# Patient Record
Sex: Female | Born: 1980 | Race: Black or African American | Hispanic: No | Marital: Single | Smoking: Former smoker
Health system: Southern US, Community
[De-identification: ages and names within clinical notes are randomized; demographics above are authoritative.]

## PROBLEM LIST (undated history)

## (undated) DIAGNOSIS — R011 Cardiac murmur, unspecified: Secondary | ICD-10-CM

## (undated) DIAGNOSIS — R51 Headache: Secondary | ICD-10-CM

## (undated) DIAGNOSIS — A749 Chlamydial infection, unspecified: Secondary | ICD-10-CM

## (undated) DIAGNOSIS — N390 Urinary tract infection, site not specified: Secondary | ICD-10-CM

## (undated) DIAGNOSIS — N730 Acute parametritis and pelvic cellulitis: Secondary | ICD-10-CM

## (undated) DIAGNOSIS — A549 Gonococcal infection, unspecified: Secondary | ICD-10-CM

## (undated) DIAGNOSIS — G8929 Other chronic pain: Secondary | ICD-10-CM

## (undated) HISTORY — PX: APPENDECTOMY: SHX54

## (undated) HISTORY — PX: FOOT SURGERY: SHX648

---
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
## 2002-08-04 ENCOUNTER — Emergency Department (HOSPITAL_COMMUNITY): Admission: EM | Admit: 2002-08-04 | Discharge: 2002-08-04 | Payer: Self-pay | Admitting: Emergency Medicine

## 2002-08-06 ENCOUNTER — Emergency Department (HOSPITAL_COMMUNITY): Admission: EM | Admit: 2002-08-06 | Discharge: 2002-08-06 | Payer: Self-pay | Admitting: Emergency Medicine

## 2005-08-07 ENCOUNTER — Emergency Department (HOSPITAL_COMMUNITY): Admission: EM | Admit: 2005-08-07 | Discharge: 2005-08-07 | Payer: Self-pay | Admitting: Emergency Medicine

## 2006-07-20 ENCOUNTER — Encounter (INDEPENDENT_AMBULATORY_CARE_PROVIDER_SITE_OTHER): Payer: Self-pay | Admitting: *Deleted

## 2006-07-20 ENCOUNTER — Observation Stay (HOSPITAL_COMMUNITY): Admission: EM | Admit: 2006-07-20 | Discharge: 2006-07-21 | Payer: Self-pay | Admitting: Emergency Medicine

## 2007-01-08 ENCOUNTER — Emergency Department (HOSPITAL_COMMUNITY): Admission: EM | Admit: 2007-01-08 | Discharge: 2007-01-08 | Payer: Self-pay | Admitting: Emergency Medicine

## 2007-05-15 ENCOUNTER — Emergency Department (HOSPITAL_COMMUNITY): Admission: EM | Admit: 2007-05-15 | Discharge: 2007-05-15 | Payer: Self-pay | Admitting: Emergency Medicine

## 2007-06-10 ENCOUNTER — Emergency Department (HOSPITAL_COMMUNITY): Admission: EM | Admit: 2007-06-10 | Discharge: 2007-06-10 | Payer: Self-pay | Admitting: *Deleted

## 2007-10-08 IMAGING — CT CT HEAD W/O CM
1 series · 16 of 30 positions shown, 20 images · IV contrast (agent unspecified)
Comparison: none

CLINICAL DATA: Headache for several days. 
 HEAD CT WITHOUT CONTRAST:
TECHNIQUE: Contiguous axial images were obtained from the base of the skull through the vertex according to standard protocol without contrast.

[Series 2: head routine 4.8 h47s · axial · 0.39mm/px · z∈[-197,-69]mm · 16 of 30 slices shown, 20 images]
[im 2/30  brain]
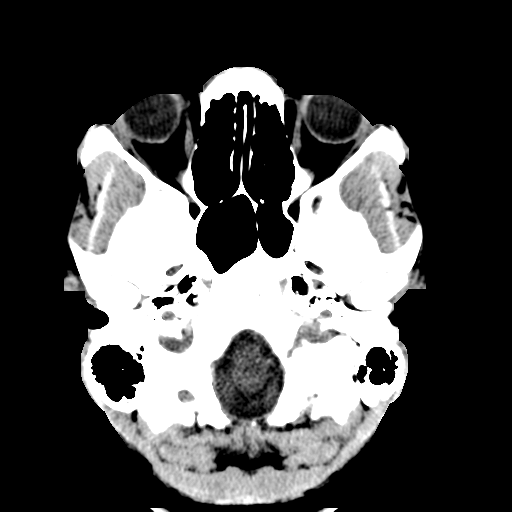
[im 2/30  bone]
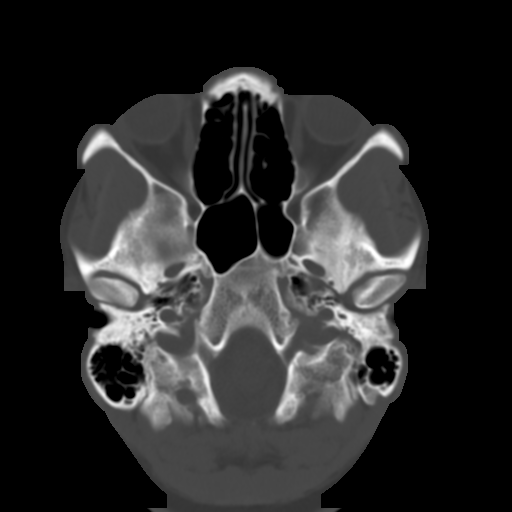
[im 4/30  brain]
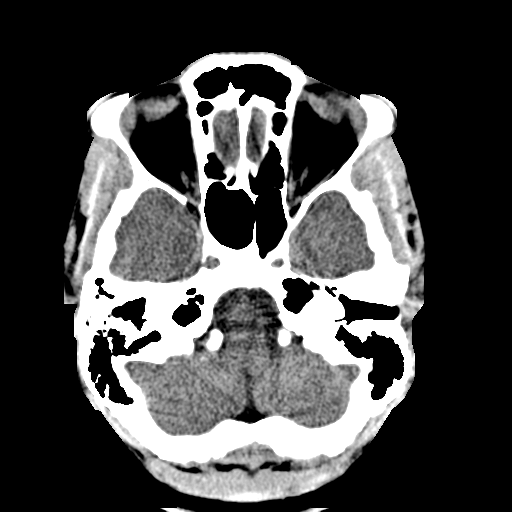
[im 6/30  brain]
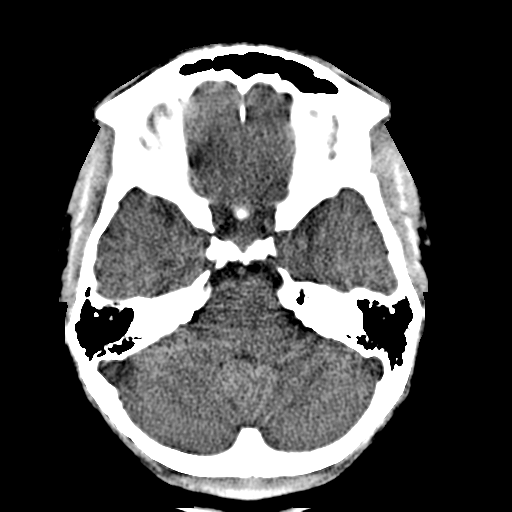
[im 8/30  brain]
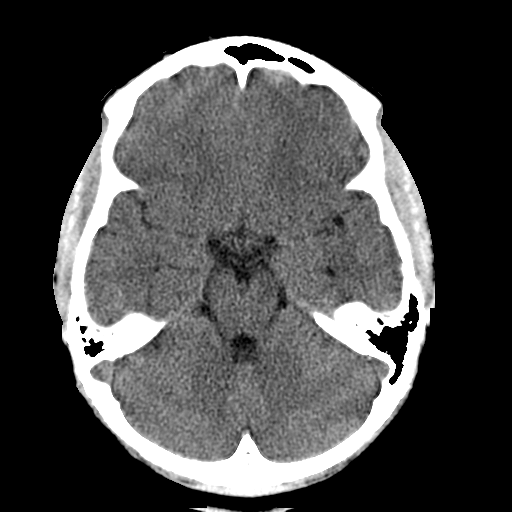
[im 9/30  brain]
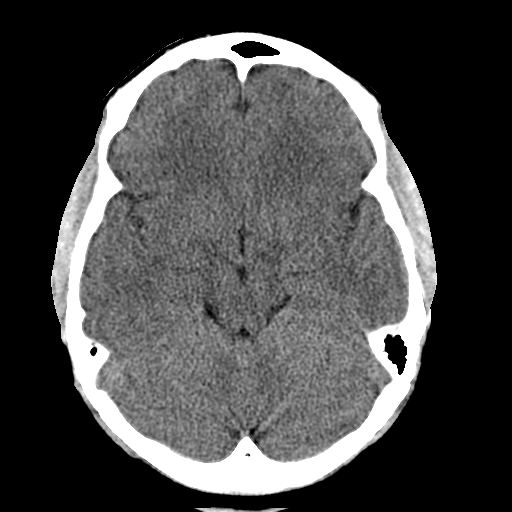
[im 9/30  bone]
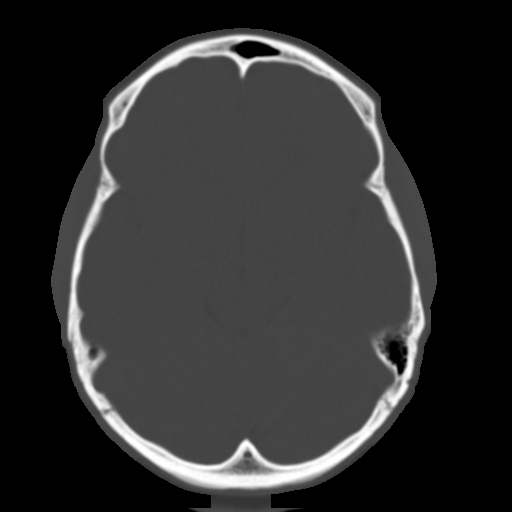
[im 11/30  brain]
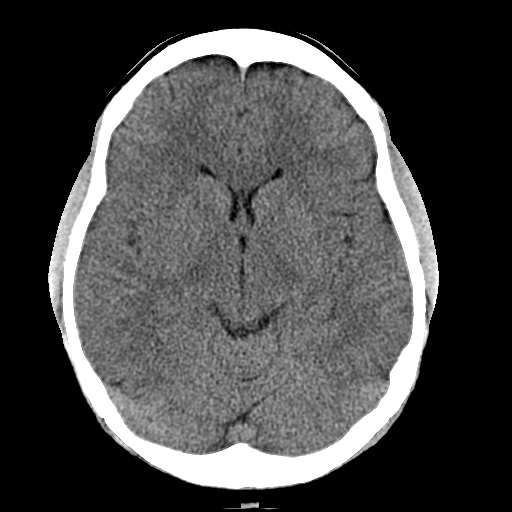
[im 13/30  brain]
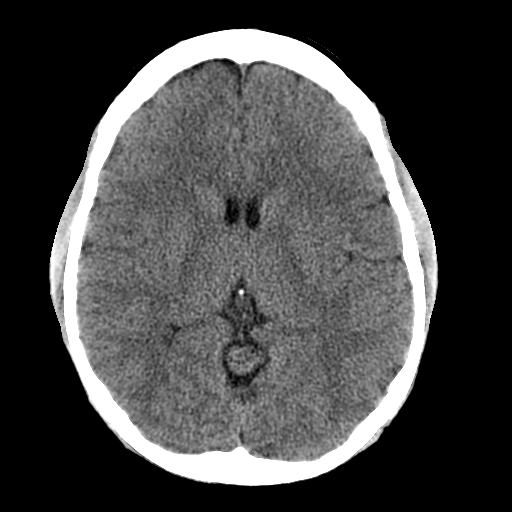
[im 15/30  brain]
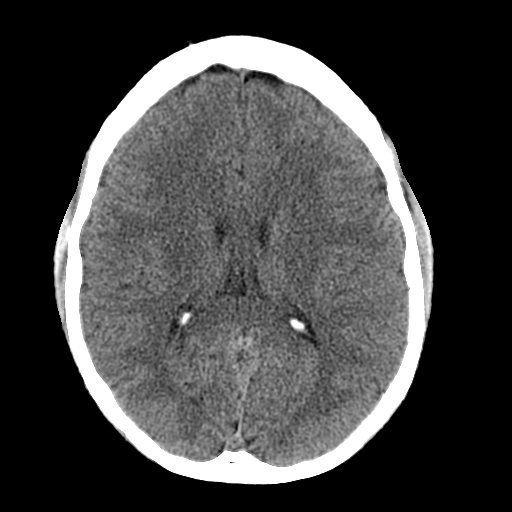
[im 16/30  brain]
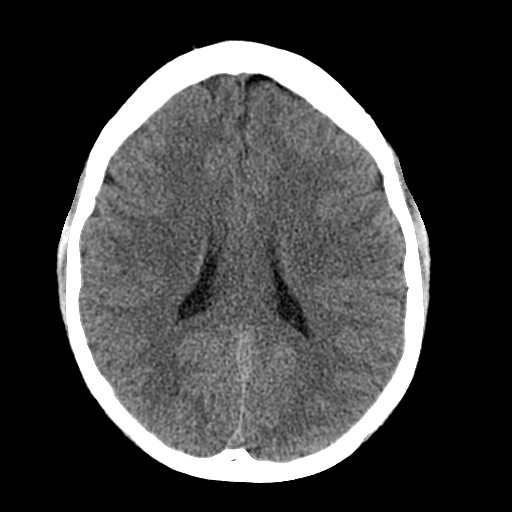
[im 16/30  bone]
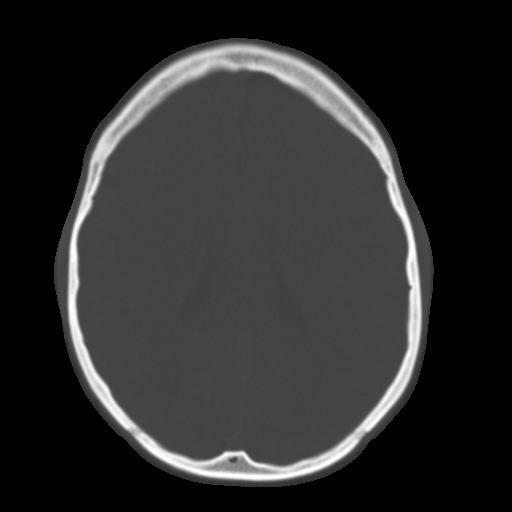
[im 18/30  brain]
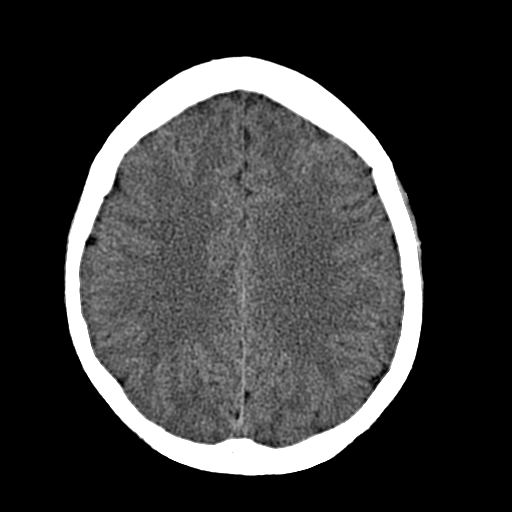
[im 20/30  brain]
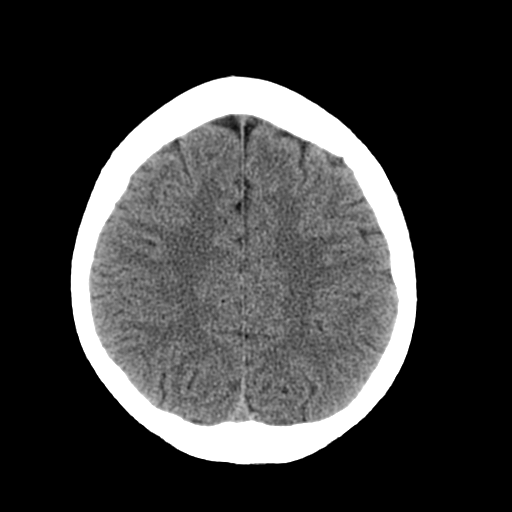
[im 22/30  brain]
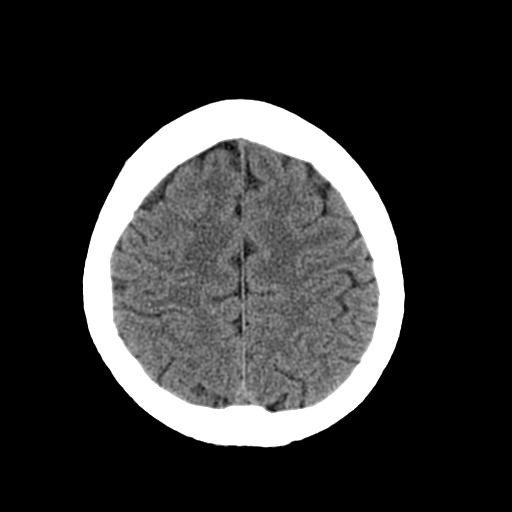
[im 23/30  brain]
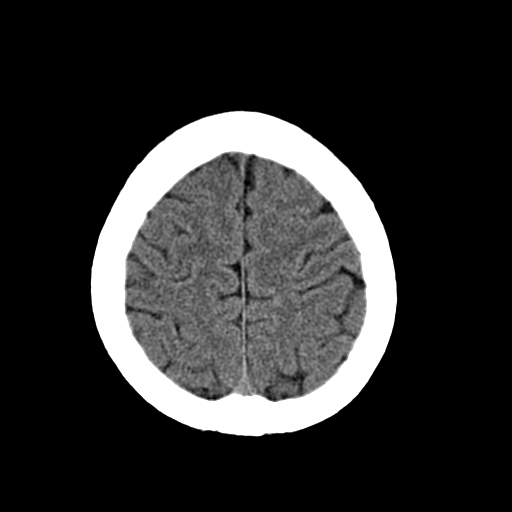
[im 23/30  bone]
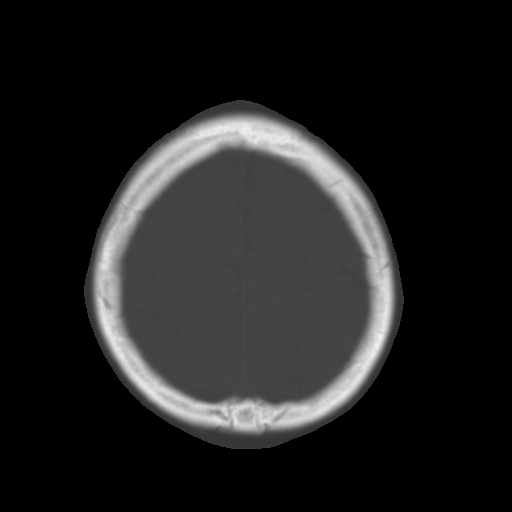
[im 25/30  brain]
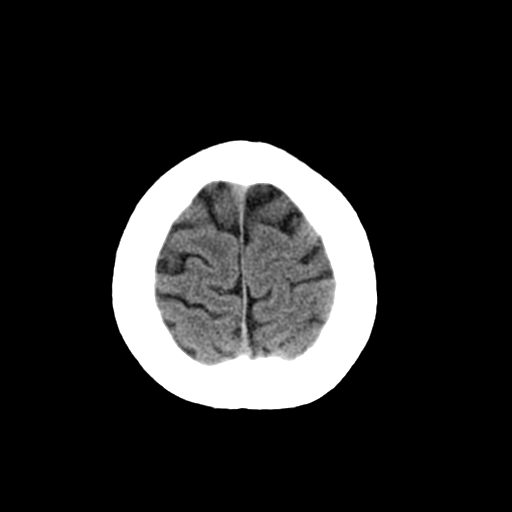
[im 27/30  brain]
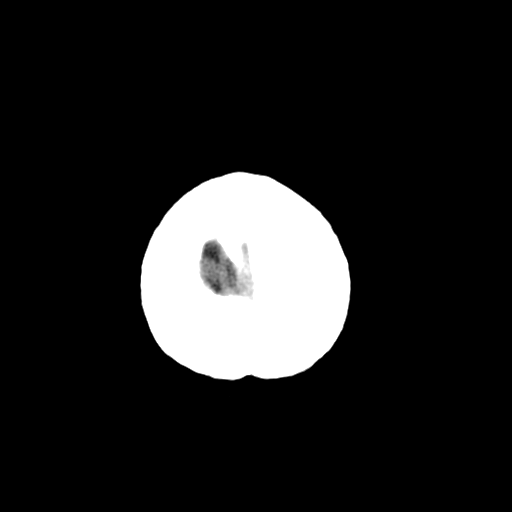
[im 29/30  brain]
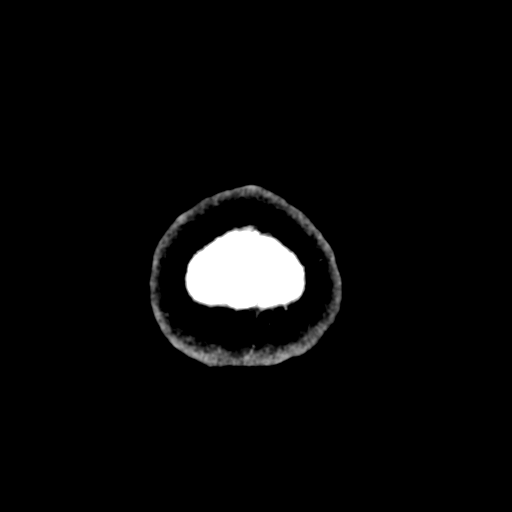

[16 of 30 positions shown; findings below may reference images not displayed]

FINDINGS: The ventricular system is normal in size and configuration and the septum is in a normal midline position. The fourth ventricle and basilar cisterns appear normal. No blood, edema or mass effect is seen. No bony abnormality is noted.
IMPRESSION: No acute intracranial abnormality.

## 2007-10-09 ENCOUNTER — Inpatient Hospital Stay (HOSPITAL_COMMUNITY): Admission: AD | Admit: 2007-10-09 | Discharge: 2007-10-09 | Payer: Self-pay | Admitting: Obstetrics & Gynecology

## 2007-10-23 ENCOUNTER — Inpatient Hospital Stay (HOSPITAL_COMMUNITY): Admission: AD | Admit: 2007-10-23 | Discharge: 2007-10-23 | Payer: Self-pay | Admitting: Obstetrics & Gynecology

## 2007-11-25 ENCOUNTER — Inpatient Hospital Stay (HOSPITAL_COMMUNITY): Admission: AD | Admit: 2007-11-25 | Discharge: 2007-11-25 | Payer: Self-pay | Admitting: Obstetrics

## 2007-11-28 ENCOUNTER — Inpatient Hospital Stay (HOSPITAL_COMMUNITY): Admission: AD | Admit: 2007-11-28 | Discharge: 2007-11-28 | Payer: Self-pay | Admitting: Obstetrics

## 2008-01-10 ENCOUNTER — Ambulatory Visit (HOSPITAL_COMMUNITY): Admission: RE | Admit: 2008-01-10 | Discharge: 2008-01-10 | Payer: Self-pay | Admitting: Obstetrics

## 2008-01-23 ENCOUNTER — Emergency Department (HOSPITAL_COMMUNITY): Admission: EM | Admit: 2008-01-23 | Discharge: 2008-01-23 | Payer: Self-pay | Admitting: Emergency Medicine

## 2008-02-22 ENCOUNTER — Inpatient Hospital Stay (HOSPITAL_COMMUNITY): Admission: AD | Admit: 2008-02-22 | Discharge: 2008-02-23 | Payer: Self-pay | Admitting: Obstetrics

## 2008-03-19 ENCOUNTER — Inpatient Hospital Stay (HOSPITAL_COMMUNITY): Admission: AD | Admit: 2008-03-19 | Discharge: 2008-03-19 | Payer: Self-pay | Admitting: Obstetrics

## 2008-03-24 IMAGING — US US OB COMP LESS 14 WK
1 series · 14 of 28 positions shown · non-contrast
Comparison: none

CLINICAL DATA: 25-year-old female with positive pregnancy test. Abdominal pain and bleeding. 
 OBSTETRICAL ULTRASOUND <14 WKS:
TECHNIQUE: Transabdominal ultrasound was performed for evaluation of the gestation as well as the maternal uterus and adnexal regions.

[Series 1: us ob comp less 14 wks · 14 of 30 slices shown]
[im 2/30]
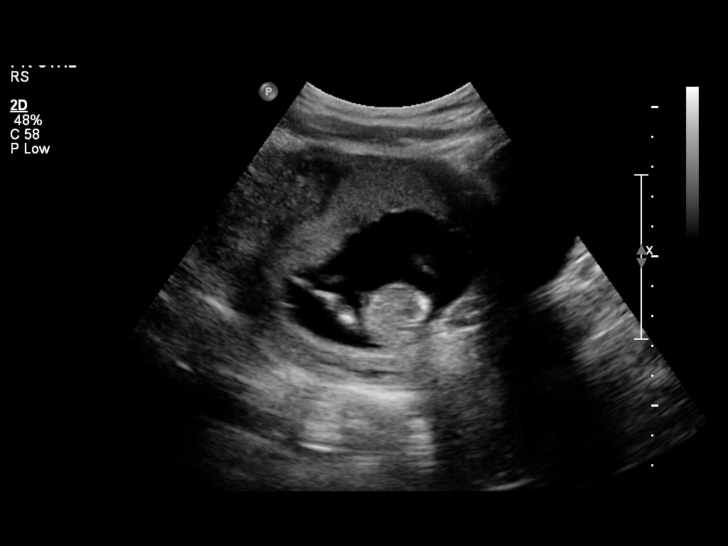
[im 4/30]
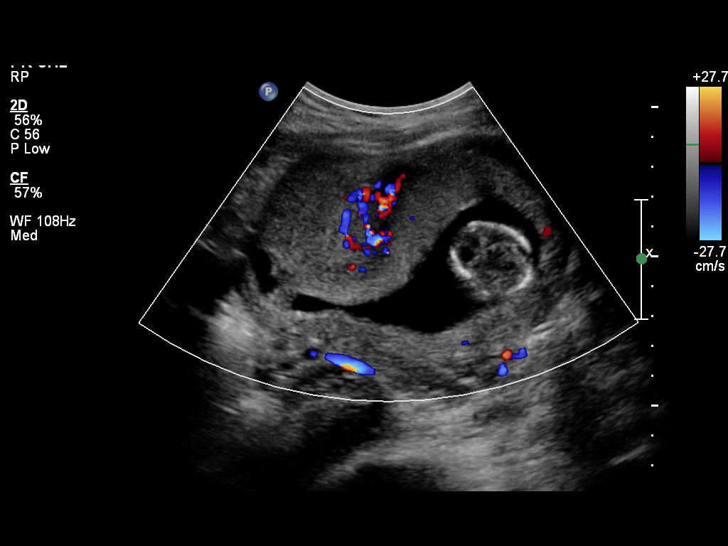
[im 6/30]
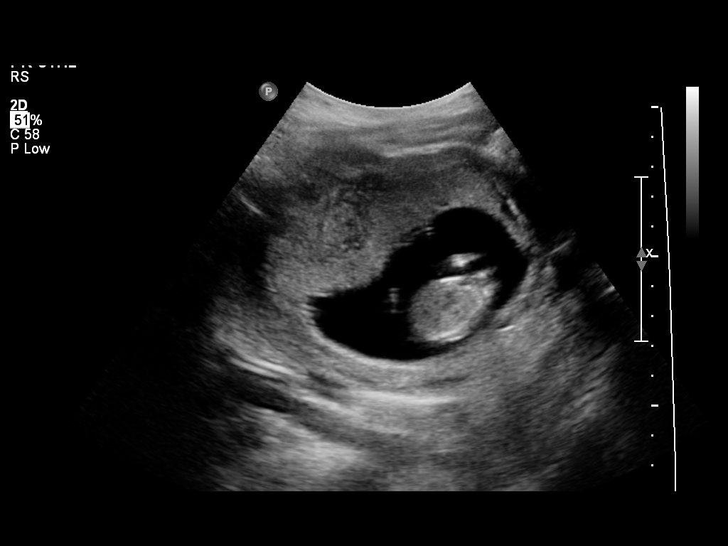
[im 8/30]
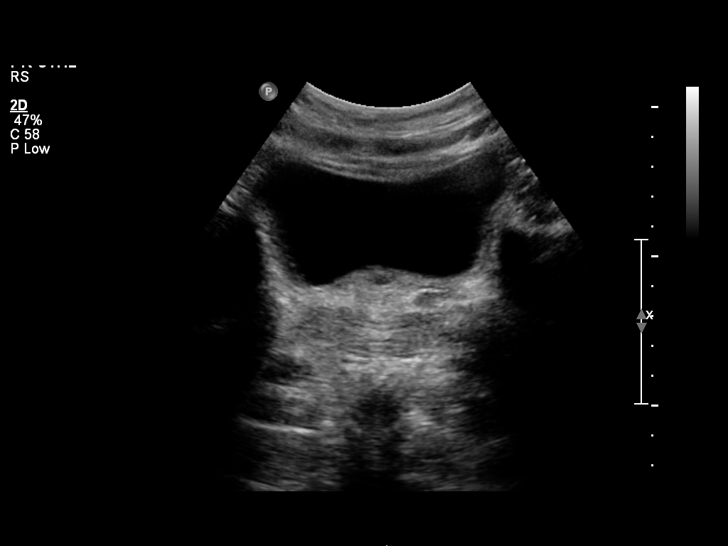
[im 10/30]
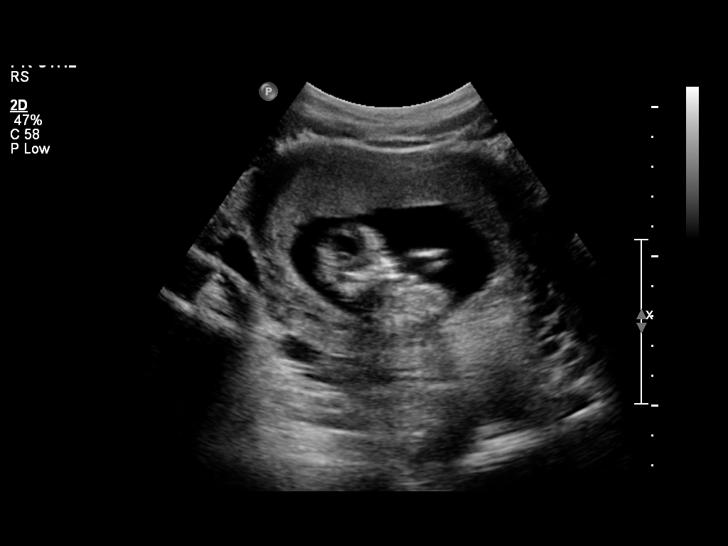
[im 12/30]
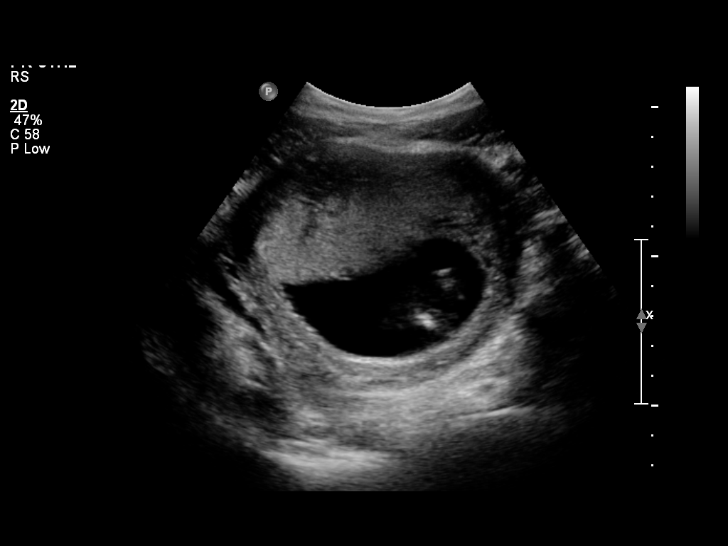
[im 14/30]
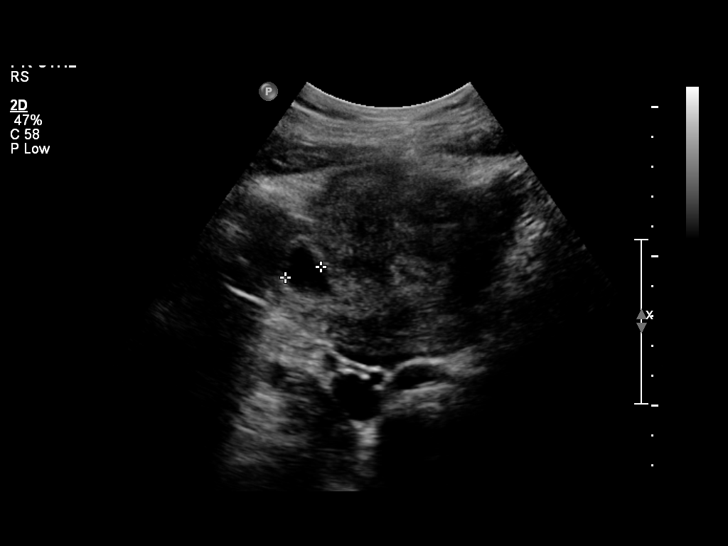
[im 17/30]
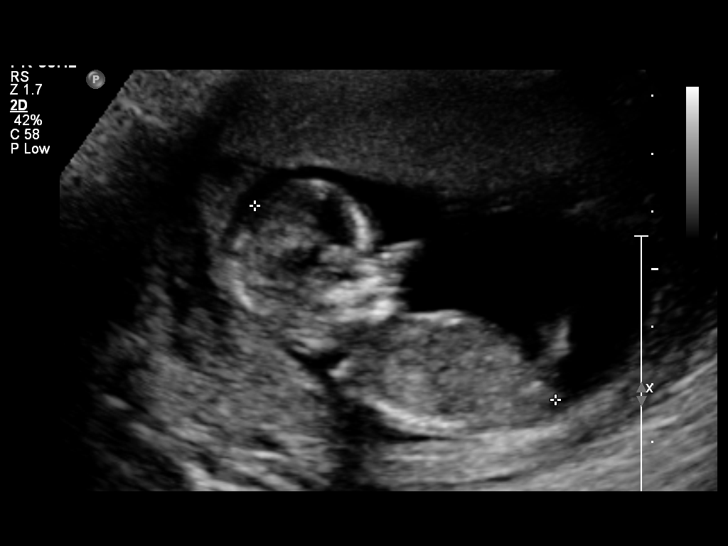
[im 19/30]
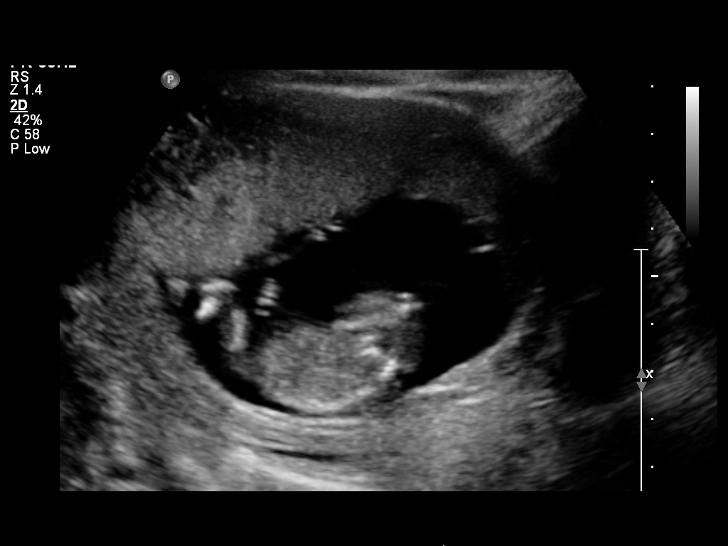
[im 21/30]
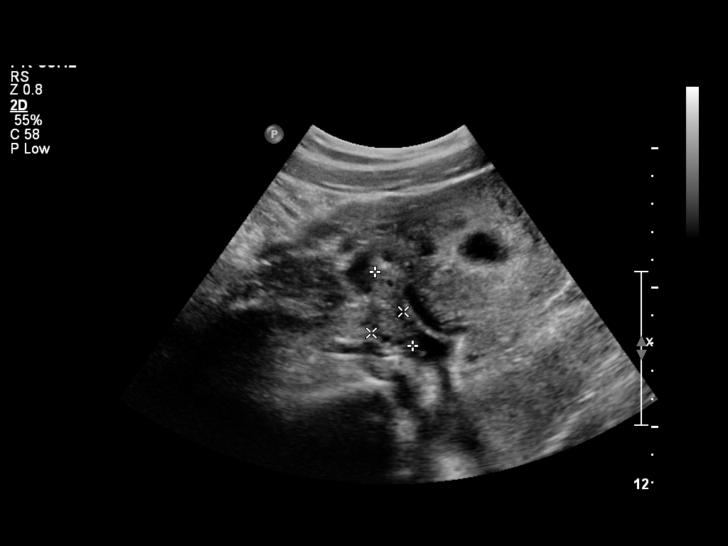
[im 23/30]
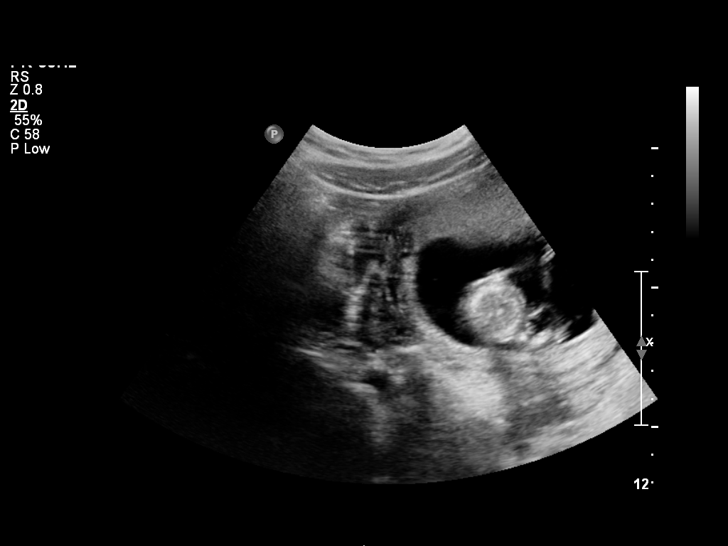
[im 25/30]
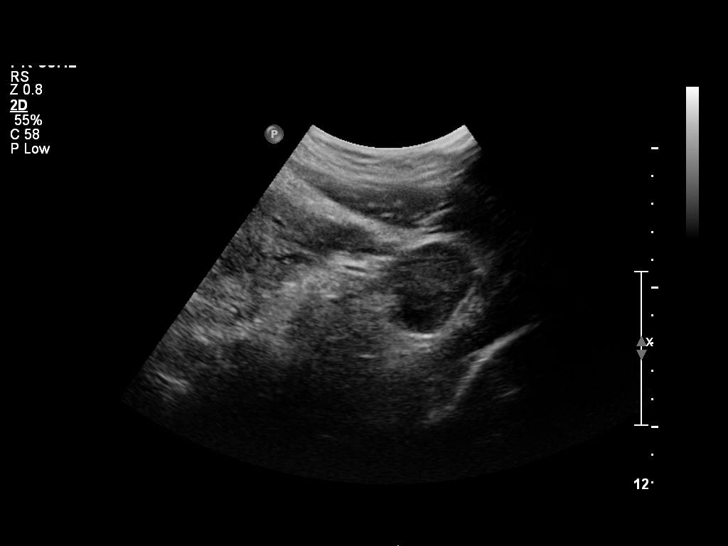
[im 27/30]
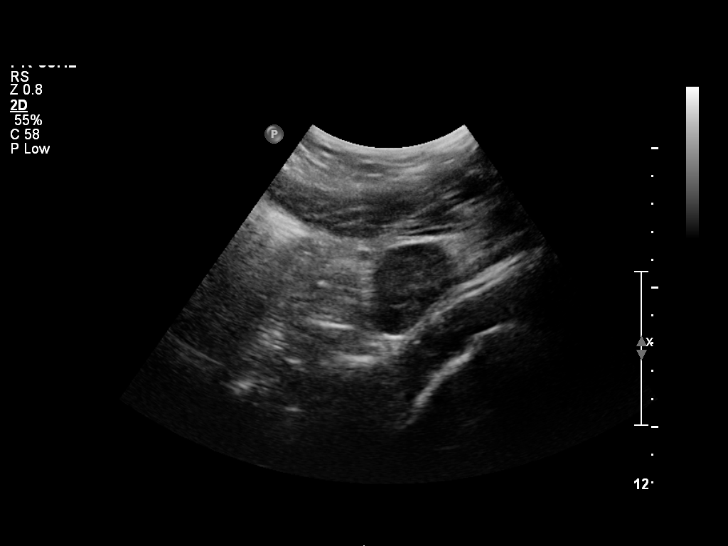
[im 30/30]
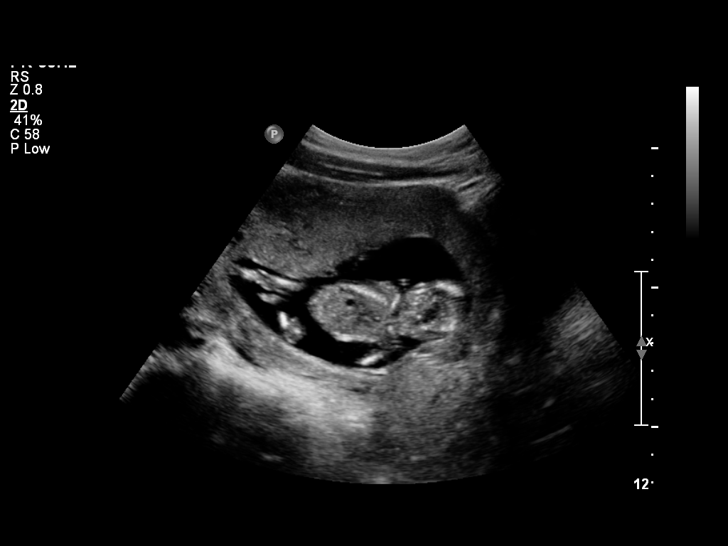

[14 of 28 positions shown; findings below may reference images not displayed]

FINDINGS: There is a single living intrauterine gestation with yolk sac and embryo identified.  Crown rump length measures 6.34 cm corresponding to a gestational age of 12 weeks 6 days.  Fetal cardiac activity measures 140 beats per minute.  A small subchorionic hemorrhage is noted.  The ovaries bilaterally are within normal limits.  There is no evidence of adnexal mass or free fluid.
IMPRESSION: Single      living intrauterine gestation with estimated gestational age of 12 weeks 6      days by this ultrasound.
  Small      subchorionic hemorrhage.

## 2008-05-29 ENCOUNTER — Inpatient Hospital Stay (HOSPITAL_COMMUNITY): Admission: RE | Admit: 2008-05-29 | Discharge: 2008-06-01 | Payer: Self-pay | Admitting: Obstetrics

## 2008-12-17 ENCOUNTER — Emergency Department (HOSPITAL_COMMUNITY): Admission: EM | Admit: 2008-12-17 | Discharge: 2008-12-17 | Payer: Self-pay | Admitting: Emergency Medicine

## 2009-05-07 ENCOUNTER — Emergency Department (HOSPITAL_COMMUNITY): Admission: EM | Admit: 2009-05-07 | Discharge: 2009-05-08 | Payer: Self-pay | Admitting: Emergency Medicine

## 2009-07-04 ENCOUNTER — Encounter: Admission: RE | Admit: 2009-07-04 | Discharge: 2009-07-04 | Payer: Self-pay | Admitting: Neurology

## 2009-09-17 ENCOUNTER — Emergency Department (HOSPITAL_COMMUNITY): Admission: EM | Admit: 2009-09-17 | Discharge: 2009-09-17 | Payer: Self-pay | Admitting: Emergency Medicine

## 2010-05-28 ENCOUNTER — Emergency Department (HOSPITAL_COMMUNITY): Admission: EM | Admit: 2010-05-28 | Discharge: 2010-05-28 | Payer: Self-pay | Admitting: Emergency Medicine

## 2011-01-18 ENCOUNTER — Encounter: Payer: Self-pay | Admitting: Neurology

## 2011-04-14 ENCOUNTER — Emergency Department (HOSPITAL_COMMUNITY)
Admission: EM | Admit: 2011-04-14 | Discharge: 2011-04-14 | Disposition: A | Discharge status: Home / Self Care | Payer: Self-pay | Attending: Emergency Medicine | Admitting: Emergency Medicine

## 2011-04-14 DIAGNOSIS — H9209 Otalgia, unspecified ear: Secondary | ICD-10-CM | POA: Insufficient documentation

## 2011-04-14 DIAGNOSIS — H6690 Otitis media, unspecified, unspecified ear: Secondary | ICD-10-CM | POA: Insufficient documentation

## 2011-04-14 DIAGNOSIS — S90569A Insect bite (nonvenomous), unspecified ankle, initial encounter: Secondary | ICD-10-CM | POA: Insufficient documentation

## 2011-04-23 ENCOUNTER — Emergency Department (HOSPITAL_COMMUNITY)
Admission: EM | Admit: 2011-04-23 | Discharge: 2011-04-23 | Disposition: A | Discharge status: Home / Self Care | Payer: Self-pay | Attending: Emergency Medicine | Admitting: Emergency Medicine

## 2011-04-23 DIAGNOSIS — L03119 Cellulitis of unspecified part of limb: Secondary | ICD-10-CM | POA: Insufficient documentation

## 2011-04-23 DIAGNOSIS — M79609 Pain in unspecified limb: Secondary | ICD-10-CM | POA: Insufficient documentation

## 2011-04-23 DIAGNOSIS — M7989 Other specified soft tissue disorders: Secondary | ICD-10-CM | POA: Insufficient documentation

## 2011-04-23 DIAGNOSIS — L02419 Cutaneous abscess of limb, unspecified: Secondary | ICD-10-CM | POA: Insufficient documentation

## 2011-05-11 NOTE — Op Note (Signed)
[name], [name] NO.:  [id number]   MEDICAL RECORD NO.:  [id number]          PATIENT TYPE:  INP   LOCATION:  9135                          FACILITY:  [facility]   PHYSICIAN:  Kathreen Cosier, M.D.DATE OF BIRTH:  12-30-1980   DATE OF PROCEDURE:  05/29/2008  DATE OF DISCHARGE:                               OPERATIVE REPORT   PREOPERATIVE DIAGNOSIS:  Previous cesarean section at term, desires  repeat.   POSTOPERATIVE DIAGNOSIS:  Repeat classical cesarean section.   SURGEON:  Kathreen Cosier, MD   FIRST ASSISTANT:  Charles A. Clearance Coots, MD   ANESTHESIA:  Spinal.   PROCEDURE:  The patient was placed on the operating table in supine  position after the spinal administered.  Abdomen was prepped and draped.  Bladder emptied with a Foley catheter.  Transverse suprapubic incision  made through the old scar, carried down to the fascia.  Fascia cleaned  and incised to the length of the incision.  Recti muscles were retracted  laterally.  The uterus was intimately adherent to the abdominal wall.  Access to the lower segment could not be obtained and a classical  incision was made on the uterus.  The placenta was anterior and the  placenta was separated from the uterus, so access could be gained to the  fetus.  Membranes were intact and the fluid clear membranes ruptured and  she was delivered of a female, Apgars 3 and 9, weighing 7 pounds 3  ounces.  The team was in attendance.  The placenta was removed manually  and sent to labor and delivery.  Uterine cavity was cleaned with dry  laps.  The uterine incision was then closed in 2 layers with continuous  #1 suture.  The hemostasis was satisfactory and the urine remained  clear.  Abdomen closed in layers.  Fascia with continuous suture of 0  Dexon and the skin closed with subcuticular stitch of 4-0 Monocryl.  Blood loss was 700 mL.  The patient was taken to the recovery room in  good condition.     ______________________________  Kathreen Cosier, M.D.     BAM/MEDQ  D:  05/29/2008  T:  05/30/2008  Job:  [id number]

## 2011-05-14 NOTE — Op Note (Signed)
[facility]  Patient:    Tammie Brown, Tammie Brown                       MRN: [id number] Proc. Date: 10/25/00 Attending:  Tammi Sou                           Operative Report  PREOPERATIVE DIAGNOSES:       Arrest of dilatation, repetitive late fetal heart rate decelerations, meconium.  POSTOPERATIVE DIAGNOSES:      Arrest of dilatation, repetitive late fetal heart rate decelerations, meconium.  PROCEDURE:                    Primary low transverse cesarean section.  SURGEON:                      Charles A. Clearance Coots, M.D.  ASSISTANT:                    Linna Hoff, certified OR technician.  ANESTHESIA:                   Epidural.  ESTIMATED BLOOD LOSS:         800 ml.  INTRAVENOUS FLUIDS:           1200 ml.  URINE OUT:                    150 ml clear.  COMPLICATIONS:                None.  FOLEY:                        To gravity.  FINDINGS:                     Viable female at 46.  Apgars of 2 at one minute, 7 at five minutes.  Weight 8 pounds 11 ounces.  Cord pH 7.29.  Normal uterus, ovaries, and fallopian tubes.  OPERATION:                    Patient was brought to the operating room after satisfactory redosing of the epidural.  The abdomen was prepped and draped in the usual sterile fashion.  A Pfannenstiel skin incision was made with the scalpel that was deepened down to the fascia with the scalpel.  The fascia was nicked in the midline and the fascial incision was extended to the left and to the right with curved Mayo scissors.  The superior and inferior fascial edges were taken off of the rectus muscle with both blunt and sharp dissection.  The rectus muscle was bluntly and sharply divided in the midline superiorly and inferiorly, being careful to avoid the urinary bladder.  Peritoneum was entered digitally and was digitally extended to the left and to the right. The bladder blade was positioned.  The vesicouterine fold of  the peritoneum above the reflection of the urinary bladder was grasped with forceps and was incised and undermined with Metzenbaum scissors.  The incision was extended to the left and to the right with the Metzenbaum scissors.  The bladder flap was bluntly developed and the bladder blade was repositioned in front of the urinary bladder, placing it well out of operative field.  The uterus was then entered with short strokes of the scalpel.  Moderate amount  of clear fluid was expelled that was malodorous.  The uterine incision was extended to the left and to the right in a smile configuration with the bandage scissors.  The vertex was then delivered with the aid of fundal pressure from the assistant. The infants mouth and nose were suctioned with a suction bulb and the delivery was completed with the aid of fundal pressure from the assistant. The umbilical cord was doubly clamped and cut and the infant was handed off to the nursery staff.  Cord pH and cord blood were obtained and the placenta was spontaneously expelled from the uterine cavity intact.  Uterus was exteriorized and the endometrial surface was thoroughly debrided with a dry lap sponge.  The edges of the uterine incision were grasped with the ring forceps and the uterus was closed with a continuous interlocking suture of 0 Monocryl from each corner to the center.  Inferior extension of the incision was repaired with a continuous interlocking suture of 0 Monocryl.  Hemostasis was excellent.  The uterus was placed back in its normal anatomic position. Pelvic cavity was thoroughly irrigated with warm saline solution and all clots were removed.  The closure of the uterus was again observed for hemostasis and there was no active bleeding noted.  The abdomen was then closed as follows: The rectus muscle was approximated with a few interrupted sutures of 0 Monocryl.  Fascia was closed with a continuous suture of 0 PDS from each corner to  the center.  Subcutaneous tissue was thoroughly irrigated with warm saline solution and all areas of subcutaneous bleeding were coagulated with the Bovie.  Skin was then approximated with surgical stainless steel staples. A sterile pressure bandage was applied to the incision closure.  Surgical technician indicated that all needle, sponge, and instrument counts were correct.  The patient tolerated procedure well and was transported to the recovery room in satisfactory condition. DD:  10/25/00 TD:  10/25/00 Job: [id number]

## 2011-05-14 NOTE — H&P (Signed)
NAMEORPAH, HAUSNER                ACCOUNT NO.:  [id number]   MEDICAL RECORD NO.:  [id number]          PATIENT TYPE:  INP   LOCATION:  2550                         FACILITY:  [facility]   PHYSICIAN:  Sharlet Salina T. Hoxworth, M.D.DATE OF BIRTH:  16-Nov-1981   DATE OF ADMISSION:  07/20/2006  DATE OF DISCHARGE:                                HISTORY & PHYSICAL   CHIEF COMPLAINT:  Right lower quadrant abdominal pain.   HISTORY OF PRESENT ILLNESS:  Tammie Brown is a 30 year old black female who  presented to [facility] emergency room with 36 hours of progressive right  lower quadrant abdominal pain.  She describes constant pain, sharp in  nature, which prevented her from sleeping the night before.  She has had  definite lack of appetite with no nausea or vomiting.  No fevers or chills.  She has had some urinary frequency.  No vaginal discharge.  She states that  she has had some similar pains in past years that have been less severe in  path and resolved spontaneously.  States the pain is not any worse with  walking but is relieved by raising her right knee towards her chest.   PAST MEDICAL HISTORY:  Surgery is significant for C-section.  Medically, she  has a history of PID but no hospitalizations.   MEDICATIONS:  None.   ALLERGIES:  None.   SOCIAL HISTORY:  She smokes a half pack of cigarettes a day.  Denies  alcohol.   FAMILY HISTORY:  Noncontributory.   REVIEW OF SYSTEMS:  GENERAL:  No fevers or chills.  Weight stable.  RESPIRATORY:  No shortness of breath or cough.  CARDIAC:  No chest pain,  palpitations, or history of heart disease.  ABDOMEN/GI:  As above.  GU:  As  above.  EXTREMITIES:  No joint pain or swelling.   PHYSICAL EXAMINATION:  VITAL SIGNS:  Temperature 98.1, pulse 74 and regular,  respirations 24, blood pressure 99/47.  GENERAL:  A well-developed and well-nourished black female in no acute  distress.  SKIN:  Warm and dry without rash or infection.  HEENT:  No palpable  mass or thyromegaly.  Sclerae are anicteric.  Nares and  oropharynx clear.  LUNGS:  Clear to auscultation without wheezing or increasing work of  breathing.  CARDIOVASCULAR:  Regular rate and rhythm.  No murmurs.  ABDOMEN:  There is significant tenderness in the right lower quadrant with  guarding and rebound tenderness.  There is referred tenderness to the right  lower quadrant with palpation to the left lower quadrant.  No palpable  masses or organomegaly.  PELVIC:  Not repeated.  Performed by ED physician, who reported no cervical  motion tenderness.  EXTREMITIES:  No joint swelling or deformity.  NEUROLOGIC:  Alert and oriented.  Gait normal.   LABORATORY:  White count 6.8.  No left shift.  Hemoglobin 9.8.  Electrolytes  normal.  Wet prep positive for Trichomonas.  UA 11-20 white cells and  moderate leukocyte esterase.   A CT scan of the abdomen and pelvis were obtained from the emergency room,  which I reviewed.  This shows some apparent inflammatory change around the  tip of the appendix, consistent with early appendicitis.   ASSESSMENT/PLAN:  Right lower quadrant pain, of concern due to the degree of  tenderness and rebound.  She does not have fever or white count.  CT scan,  however, is suspicious for appendicitis.  With this constellation of  findings, I feel it is safest to proceed with laparoscopic appendectomy, and  this was recommended and accepted.  The patient will be taken to the  operating room for emergency laparoscopic appendectomy.      Lorne Skeens. Hoxworth, M.D.  Electronically Signed     BTH/MEDQ  D:  07/20/2006  T:  07/20/2006  Job:  [id number]

## 2011-05-14 NOTE — Discharge Summary (Signed)
Tammie Brown, BASINSKI NO.:  [id number]   MEDICAL RECORD NO.:  [id number]          PATIENT TYPE:  INP   LOCATION:  9135                          FACILITY[facility]  [facility]   PHYSICIAN:  Kathreen Cosier, M.D.DATE OF BIRTH:  [birth year]   DATE OF ADMISSION:  05/29/2008  DATE OF DISCHARGE:  06/01/2008                               DISCHARGE SUMMARY   The patient is a 30 year old gravida 2, para 1-0-1, [facility] June 04, 2008.  She had a previous C-section and she had a positive GBS and she desired  repeat C-section.  On May 29, 2008, she underwent repeat C-section and  she had a large amount of adhesions.  The uterus was intimately adherent  to the anterior abdominal wall.  Lower segment inaccessible..  Classical  incision was made on the uterus and she delivered a female, Apgar 3 and  9, weighing 7 pound 3-ounces.  Postoperatively, she did well.  On  admission, her hemoglobin was 12.1, postop 10.4.  White count 12.5, 16.  Platelet 238-218.  RPR was nonreactive.  Urine was negative.  She was  discharged home on the third postoperative day, ambulatory, on a regular  diet, on Tylox for pain, to see me in 6 weeks.   DISCHARGE DIAGNOSIS:  Status post classical cesarean section for repeat.     ______________________________  Kathreen Cosier, M.D.    ______________________________  Kathreen Cosier, M.D.    BAM/MEDQ  D:  06/19/2008  T:  06/20/2008  Job:  [id number]

## 2011-05-14 NOTE — Discharge Summary (Signed)
[facility]  Patient:    Tammie Brown, Tammie Brown Visit Number: [id number] MRN: [id number]          Service Type: Dictated by:   Marvis Moeller, CHM Adm. Date:  10/24/00 Disc. Date: 10/28/00                             Discharge Summary  ADMISSION DIAGNOSES:          ______ prima gravida, term pregnancy, early labor, rubella non-immune.  DISCHARGE DIAGNOSES:          Primary low transverse cesarean section, viable female 8 pounds 11 ounces.  HISTORY:                      This is a 30 year old prima gravida who presented at 56 2/7 well dated by LMP consistent with a second trimester ultrasound.  She was complaining of uterine contractions that had become painful and more regular, about every five to 10 minutes.  She had significant social issues living in a group home for runaways.  She had no bleeding except slight spotting.  No leaking.  Reported good fetal movement.  [facility]  She had a UTI which was treated in second trimester. 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  She was followed by the Child psychotherapist and drug and alcohol counselor due to smoking and her social situation.  Of note, her one hour glucola screen was normal at 76 and her ultrasound at 30+ weeks showed the expected interval growth.  There was no polyhydramnios.  PRENATAL LABORATORIES:        Rubella non-immune.  PAST MEDICAL HISTORY:         Noncontributory.  PAST SURGICAL HISTORY:        None.  ALLERGIES:                    None.  MEDICATIONS:                  Prenatal vitamins.  PHYSICAL EXAMINATION:  PELVIC:                       Cervix:  2-3 cm dilated, 50% effaced, vertex presenting at -3 station.  The fetal heart rate was in the 145-155 range and there were positive accelerations.  No decelerations.  Uterine contractions were occurring every five to seven minutes and were firm.  HOSPITAL COURSE:              Patient was admitted for  expectant management. She did not make progress in labor after she had artificial rupture of membranes at 0200 when she was 4 cm, 90%, -2 vertex.  The amniotic fluid was meconium stained and she was amnio-infused.  She made no cervical change after four hours with contractions occurring every two to four minutes.  She was noted to have repetitive late decelerations of the fetal heart rate and the decision was made by Dr. Clearance Coots to proceed with primary low transverse cesarean section delivery for arrest of dilatation, repetitive late decelerations, and meconium.  Of note, the patient also was febrile in labor with a temperature of 101.4.  The findings at cesarean section were a viable female Apgars 2 at one, 7 at five minutes; weight 8 pounds 11 ounces.  Cord pH 7.29.  Normal genitalia.  Her postoperative course was significant  for her having rigors and also significant abdominal tenderness at the fundus as well as at the incision site.  She did remain afebrile with pulse rate around 100 which decreased on day #1 to 80 and below.  She was initially started on Unasyn and then on postoperative day #2 was changed to Augmentin.  On postoperative day #3 her incisional and abdominal and fundal pain was much improved.  She remained afebrile with vital signs stable.  Her hemoglobin was 10.4 postoperative on day of discharge.  Her incision remained clean, dry, and intact with no induration or drainage.  She was discharged on Depo-Provera 150 mg IM.  She received rubella immunization.  Her staples were removed before discharge.  She also had another social service visit before discharge.  Her follow-up was to be arranged through the social worker in six weeks in [place], [place] where she was living in a group home. Dictated by:   Marvis Moeller, CHM DD:  12/12/00 TD:  12/12/00 Job: [id number]

## 2011-05-14 NOTE — Op Note (Signed)
NAMESHYONNA, Tammie Brown                ACCOUNT NO.:  [id number]   MEDICAL RECORD NO.:  [id number]          PATIENT TYPE:  INP   LOCATION:  2550                         FACILITY:  [facility]   PHYSICIAN:  Sharlet Salina T. Hoxworth, M.D.DATE OF BIRTH:  [birth year]   DATE OF PROCEDURE:  07/20/2006  DATE OF DISCHARGE:                                 OPERATIVE REPORT   PREOPERATIVE DIAGNOSIS:  Acute appendicitis.   POSTOPERATIVE DIAGNOSIS:  Probable normal appendix.   SURGICAL PROCEDURES:  Laparoscopic appendectomy.   SURGEON:  Lorne Skeens. Hoxworth, M.D.   ANESTHESIA:  General.   BRIEF HISTORY:  Tammie Brown is a 30 year old black female who presents with 36  hours of constant right lower quadrant abdominal pain and anorexia.  Exam in  the emergency room revealed significant right lower quadrant tenderness with  guarding and a CT scan was obtained.  This showed possible appendicitis with  apparent inflammatory change around the tip of the appendix.  With this  combination of findings, I recommended proceeding with laparoscopic  appendectomy for possible appendicitis.  The nature of the procedure,  indications, risks of bleeding, infection, open procedure, negative  appendectomy were discussed and understood preoperatively.  She is brought  to the operating room for this procedure.   DESCRIPTION OF THE OPERATION:  The patient was brought to the operating room  and placed in the supine position on the operating table and general  orotracheal anesthesia was induced.  She received preoperative antibiotics.  The abdomen was widely sterilely prepped and draped.  A Foley catheter was  placed.  The correct patient and procedure were verified.  Access was  obtained through a 1 cm open incision at the umbilicus through a mattress  suture of 0 Vicryl with the Hasson technique and pneumoperitoneum  established.  There were quite a few omental adhesions to the midline from a  previous C-section in the lower abdomen.   Under direct vision, a 5 mm trocar  was placed in the right upper quadrant and these adhesions were all taken  down under direct vision with the Harmonic scalpel.  Following this, a 12 mm  trocar was placed in the left lower quadrant under direct vision.  The  appendix was visualized and appeared likely normal.  There was a  questionable small amount of congestion around the tip, but no obvious acute  inflammation.  The small bowel, cecum, and colon all appeared normal.  The  appendix was elevated and the base of the appendix and mesoappendix exposed.  The mesoappendix was sequentially divided with the Harmonic scalpel  completely freeing the appendix down to its base which was then divided  across its base with a single firing of the Endo-GIA 45 mm blue load  stapler.  The appendix was placed in an EndoCatch bag and brought out  through the umbilicus.  The operative site was inspected for hemostasis  which was complete.  The  trocars were removed under direct vision and all CO2 evacuated.  The  mattress sutures was secured at the umbilicus.  The skin incisions were  closed with subcuticular 4-0 Monocryl  and Steri-Strips.  Sponge and needle  counts were correct.  Dry, sterile dressings were applied and the patient  was taken to recovery in good condition.      Lorne Skeens. Hoxworth, M.D.  Electronically Signed     BTH/MEDQ  D:  07/20/2006  T:  07/20/2006  Job:  [id number]

## 2011-07-21 ENCOUNTER — Inpatient Hospital Stay (HOSPITAL_COMMUNITY)
Admission: AD | Admit: 2011-07-21 | Discharge: 2011-07-21 | Disposition: A | Discharge status: Home / Self Care | Payer: Medicaid Other | Source: Ambulatory Visit | Attending: Obstetrics and Gynecology | Admitting: Obstetrics and Gynecology

## 2011-07-21 ENCOUNTER — Encounter (HOSPITAL_COMMUNITY): Payer: Self-pay | Admitting: *Deleted

## 2011-07-21 DIAGNOSIS — A599 Trichomoniasis, unspecified: Secondary | ICD-10-CM | POA: Insufficient documentation

## 2011-07-21 DIAGNOSIS — O2390 Unspecified genitourinary tract infection in pregnancy, unspecified trimester: Secondary | ICD-10-CM | POA: Insufficient documentation

## 2011-07-21 DIAGNOSIS — O9989 Other specified diseases and conditions complicating pregnancy, childbirth and the puerperium: Secondary | ICD-10-CM | POA: Insufficient documentation

## 2011-07-21 DIAGNOSIS — K469 Unspecified abdominal hernia without obstruction or gangrene: Secondary | ICD-10-CM | POA: Insufficient documentation

## 2011-07-21 DIAGNOSIS — O99330 Smoking (tobacco) complicating pregnancy, unspecified trimester: Secondary | ICD-10-CM | POA: Insufficient documentation

## 2011-07-21 LAB — POCT PREGNANCY, URINE: Preg Test, Ur: NEGATIVE

## 2011-07-21 LAB — URINALYSIS, ROUTINE W REFLEX MICROSCOPIC
Bilirubin Urine: NEGATIVE
Glucose, UA: NEGATIVE mg/dL
Specific Gravity, Urine: >1.03 — ABNORMAL HIGH (ref 1.005–1.030)
pH: 6 (ref 5.0–8.0)

## 2011-07-21 LAB — URINE MICROSCOPIC-ADD ON

## 2011-07-21 LAB — WET PREP, GENITAL: Yeast Wet Prep HPF POC: NONE SEEN

## 2011-07-21 MED ORDER — METRONIDAZOLE 500 MG PO TABS
2000.0000 mg | ORAL_TABLET | Freq: Once | ORAL | Status: AC
  Administered 2011-07-21: 2000 mg via ORAL
  Filled 2011-07-21: qty 4

## 2011-07-21 NOTE — Progress Notes (Signed)
Pt presents to [facility] for pregnancy test.  States she is having pains on bottom of right hand side of abdomen.  States when she stretches it feels like pulling. Has not taken pregnancy test at home.

## 2011-07-21 NOTE — Progress Notes (Signed)
Pt called in lobby twice, 5 mins apart.  Pt just arrived from dropping child off with sister who is a patient down the hall. Taken to room 6.

## 2011-07-21 NOTE — Progress Notes (Signed)
HAS NOT DONE UPT AT HOME.

## 2011-07-21 NOTE — ED Provider Notes (Signed)
History   Pt presents for eval of low abd cramping and a 'bump' that appears around her naval when she stretches, but is not tender/painful at all. This 'bump' has been noticed  x 3-4 mos. Denies dysuria, bldg or fever. No N/V/D.  Chief Complaint  Patient presents with  . Abdominal Pain   HPI  OB History    Grav Para Term Preterm Abortions TAB SAB Ect Mult Living   2 2 2       2       Past Medical History  Diagnosis Date  . No pertinent past medical history     Past Surgical History  Procedure Date  . Cesarean section   . Appendectomy     No family history on file.  History  Substance Use Topics  . Smoking status: Current Everyday Smoker    Types: Cigarettes  . Smokeless tobacco: Not on file  . Alcohol Use: Yes    Allergies:  Allergies  Allergen Reactions  . Penicillins Rash    Prescriptions prior to admission  Medication Sig Dispense Refill  . diphenhydrAMINE-zinc acetate (BENADRYL) cream Apply 1 application topically 3 (three) times daily as needed. As needed for itching         ROS Physical Exam   Blood pressure 112/53, pulse 56, temperature 98.5 F (36.9 C), temperature source Oral, resp. rate 20, height 5\' 2"  (1.575 m), weight 80.457 kg (177 lb 6 oz), last menstrual period 06/22/2011.  Physical Exam WNL; abd: approx 2-3cm in diameter hardened area at 7 o'clock in relation to naval when pt stands (unable to palpate while lying); no tenderness with palp; pelvic: copious white vag d/c with SE, nl size/shape of ovaries and uterus, no tenderness on pelvic exam; ext nl MAU Course  Procedures  MDM   Assessment and Plan  Lower abd cramping Possible abd hernia Trich/BV  Given 2gm Flagyl here and given trich precautions. Rec f/u at [facility] for possible hernia eval (pt due for Pap also). GC/chlam pending. Increase fluid intake.  Kodee Ravert B 07/21/2011, 10:18 PM

## 2011-07-21 NOTE — Progress Notes (Signed)
SAYS  PAIN IN LOWER ABD STARTED ON 07-13-2011- ON R SIDE   .   SAYS HAS KNOT ON BELLY BUTTON.  Marland Kitchen LAST SEX- 07-14-2011- BIRTH CONTROL - CONDOMS

## 2011-07-21 NOTE — Progress Notes (Signed)
Pincus Badder, CNM in to see pt.  assessment done and poc discussed. SSE done, wet prep and gc/chlamydia collected.

## 2011-07-22 LAB — GC/CHLAMYDIA PROBE AMP, GENITAL: GC Probe Amp, Genital: NEGATIVE

## 2011-09-23 LAB — CBC
HCT: 30.9 — ABNORMAL LOW
Hemoglobin: 10.4 — ABNORMAL LOW
Hemoglobin: 12.1
MCHC: 33.8
MCV: 87.4
MCV: 88.5
Platelets: 218
Platelets: 238
RBC: 4.07
RDW: 13.1
WBC: 16 — ABNORMAL HIGH

## 2011-10-05 LAB — URINALYSIS, ROUTINE W REFLEX MICROSCOPIC
Bilirubin Urine: NEGATIVE
Ketones, ur: NEGATIVE
Leukocytes, UA: NEGATIVE
Nitrite: NEGATIVE
Specific Gravity, Urine: 1.025

## 2011-10-05 LAB — URINE MICROSCOPIC-ADD ON

## 2011-10-06 LAB — WET PREP, GENITAL: Clue Cells Wet Prep HPF POC: NONE SEEN

## 2011-10-06 LAB — URINALYSIS, ROUTINE W REFLEX MICROSCOPIC
Glucose, UA: NEGATIVE
Protein, ur: NEGATIVE

## 2011-10-06 LAB — GC/CHLAMYDIA PROBE AMP, GENITAL: Chlamydia, DNA Probe: NEGATIVE

## 2011-10-07 LAB — CBC
HCT: 37.8
MCHC: 33.6
MCV: 81.1
Platelets: 256
RDW: 17.2 — ABNORMAL HIGH
WBC: 5.4

## 2011-10-07 LAB — WET PREP, GENITAL

## 2011-10-07 LAB — URINALYSIS, ROUTINE W REFLEX MICROSCOPIC
Ketones, ur: NEGATIVE
Nitrite: NEGATIVE
Urobilinogen, UA: 0.2
pH: 7

## 2011-10-07 LAB — HCG, QUANTITATIVE, PREGNANCY: hCG, Beta Chain, Quant, S: 20931 — ABNORMAL HIGH

## 2011-10-07 LAB — GC/CHLAMYDIA PROBE AMP, GENITAL: Chlamydia, DNA Probe: NEGATIVE

## 2011-10-14 LAB — URINALYSIS, ROUTINE W REFLEX MICROSCOPIC
Ketones, ur: NEGATIVE
Protein, ur: NEGATIVE
Urobilinogen, UA: 0.2

## 2011-10-14 LAB — WET PREP, GENITAL
WBC, Wet Prep HPF POC: NONE SEEN
Yeast Wet Prep HPF POC: NONE SEEN

## 2011-10-14 LAB — POCT PREGNANCY, URINE
Operator id: 196461
Preg Test, Ur: NEGATIVE

## 2011-11-02 ENCOUNTER — Encounter (HOSPITAL_COMMUNITY): Payer: Self-pay | Admitting: Cardiology

## 2011-11-02 ENCOUNTER — Emergency Department (INDEPENDENT_AMBULATORY_CARE_PROVIDER_SITE_OTHER): Payer: Medicaid Other

## 2011-11-02 ENCOUNTER — Emergency Department (INDEPENDENT_AMBULATORY_CARE_PROVIDER_SITE_OTHER)
Admission: EM | Admit: 2011-11-02 | Discharge: 2011-11-02 | Disposition: A | Discharge status: Home / Self Care | Payer: Medicaid Other | Source: Home / Self Care | Attending: Family Medicine | Admitting: Family Medicine

## 2011-11-02 DIAGNOSIS — M6580 Other synovitis and tenosynovitis, unspecified site: Secondary | ICD-10-CM

## 2011-11-02 DIAGNOSIS — M76891 Other specified enthesopathies of right lower limb, excluding foot: Secondary | ICD-10-CM

## 2011-11-02 MED ORDER — MELOXICAM 7.5 MG PO TABS: 7.5000 mg | ORAL_TABLET | Freq: Two times a day (BID) | ORAL | Status: DC

## 2011-11-02 NOTE — ED Notes (Signed)
Knee sleeve applied to right knee. Neurovascular check WNL.

## 2011-11-02 NOTE — ED Notes (Signed)
Pt reports lower back pain going into right groin down leg from the groin to toes.  Woke pt from sleep. Putting pressure to groin or knee makes pain better. Denies injury.

## 2011-11-02 NOTE — ED Provider Notes (Signed)
History     CSN: [id number] Arrival date & time: 11/02/2011  2:21 PM   First MD Initiated Contact with Patient 11/02/11 1432      Chief Complaint  Patient presents with  . Leg Pain    (Consider location/radiation/quality/duration/timing/severity/associated sxs/prior treatment) Patient is a 30 y.o. female presenting with knee pain. The history is provided by the patient.  Knee Pain This is a new problem. The current episode started more than 1 week ago (reports spider bite to right calf in april , resolved, now leg pain for 1 week.). The problem occurs constantly. The problem has been gradually worsening. The symptoms are aggravated by walking. The symptoms are relieved by nothing. She has tried nothing for the symptoms.    Past Medical History  Diagnosis Date  . No pertinent past medical history     Past Surgical History  Procedure Date  . Cesarean section   . Appendectomy   . Cesarean section 2001 & 2009    History reviewed. No pertinent family history.  History  Substance Use Topics  . Smoking status: Current Everyday Smoker -- 0.5 packs/day    Types: Cigarettes  . Smokeless tobacco: Not on file  . Alcohol Use: Yes     occassional     OB History    Grav Para Term Preterm Abortions TAB SAB Ect Mult Living   2 2 2       2       Review of Systems  Constitutional: Negative.   Gastrointestinal: Negative.   Genitourinary: Negative.   Musculoskeletal: Positive for joint swelling, arthralgias and gait problem.  Skin: Negative.     Allergies  Penicillins  Home Medications   Current Outpatient Rx  Name Route Sig Dispense Refill  . DIPHENHYDRAMINE-ZINC ACETATE 1-0.1 % EX CREA Topical Apply 1 application topically 3 (three) times daily as needed. As needed for itching       BP 122/58  Pulse 70  Temp(Src) 98.2 F (36.8 C) (Oral)  Resp 18  SpO2 99%  LMP 10/29/2011  Physical Exam  Nursing note and vitals reviewed. Constitutional: She is oriented to  person, place, and time. She appears well-developed and well-nourished.  Abdominal: Soft. She exhibits no distension. There is no tenderness.  Musculoskeletal: Normal range of motion. She exhibits tenderness. She exhibits no edema.       Right knee: She exhibits swelling and effusion. She exhibits normal range of motion, no ecchymosis, no deformity, no erythema, no bony tenderness and no MCL laxity. tenderness found. No medial joint line and no lateral joint line tenderness noted.  Neurological: She is alert and oriented to person, place, and time. She has normal reflexes.  Skin: Skin is warm and dry.    ED Course  Procedures (including critical care time)  Labs Reviewed - No data to display No results found.   No diagnosis found.    MDM  Dg Knee 1-2 Views Right  11/02/2011  *RADIOLOGY REPORT*  Clinical Data: Diffuse knee pain following a spider bite 1 month ago.  No previous relevant surgery.  RIGHT KNEE - 1-2 VIEW  Comparison: None.  Findings: There is mild suprapatellar soft tissue fullness on the lateral view suspicious for a knee joint effusion.  No foreign body is demonstrated.  The mineralization and alignment are normal. There is no evidence of acute fracture, dislocation or bone destruction.  The joint spaces appear preserved.  IMPRESSION: No acute or significant osseous findings.  Probable joint effusion.  Original Report Authenticated  By: Gerrianne Scale, M.D.    X-rays reviewed and report per radiologist.          Barkley Bruns, MD 11/02/11 [phone]

## 2012-07-18 ENCOUNTER — Encounter (HOSPITAL_COMMUNITY): Payer: Self-pay

## 2012-07-18 ENCOUNTER — Inpatient Hospital Stay (HOSPITAL_COMMUNITY)
Admission: AD | Admit: 2012-07-18 | Discharge: 2012-07-18 | Disposition: A | Discharge status: Home / Self Care | Payer: Medicaid Other | Source: Ambulatory Visit | Attending: Obstetrics & Gynecology | Admitting: Obstetrics & Gynecology

## 2012-07-18 DIAGNOSIS — N764 Abscess of vulva: Secondary | ICD-10-CM | POA: Insufficient documentation

## 2012-07-18 DIAGNOSIS — R109 Unspecified abdominal pain: Secondary | ICD-10-CM | POA: Insufficient documentation

## 2012-07-18 DIAGNOSIS — N949 Unspecified condition associated with female genital organs and menstrual cycle: Secondary | ICD-10-CM | POA: Insufficient documentation

## 2012-07-18 LAB — POCT PREGNANCY, URINE: Preg Test, Ur: NEGATIVE

## 2012-07-18 MED ORDER — OXYCODONE-ACETAMINOPHEN 5-325 MG PO TABS: 1.0000 | ORAL_TABLET | Freq: Four times a day (QID) | ORAL | Status: AC | PRN

## 2012-07-18 MED ORDER — LIDOCAINE HCL (PF) 1 % IJ SOLN
INTRAMUSCULAR | Status: AC
  Administered 2012-07-18: 30 mL via SUBCUTANEOUS
  Filled 2012-07-18: qty 30

## 2012-07-18 MED ORDER — HYDROMORPHONE HCL PF 1 MG/ML IJ SOLN
1.0000 mg | Freq: Once | INTRAMUSCULAR | Status: AC
  Administered 2012-07-18: 1 mg via INTRAMUSCULAR
  Filled 2012-07-18: qty 1

## 2012-07-18 NOTE — MAU Provider Note (Addendum)
Pt seen and examined by me.  Agree with above.  I was present for the entire procedure. Carolyn L. Harraway-Smith, M.D., FACOG   Patient is asking for Rx for pain medication.  Will discharge her with Rx for Percocet 1-2 tabs po q6 hrs prn pain.  #10 no refills

## 2012-07-18 NOTE — MAU Note (Addendum)
Pain in abd- knot beside navel- feels like needles poking now for past 2-3 months.  Has a lump near introitus/left labia- first noted 2-3 days ago.  Started throbbing with pain this morning around 0430.  Pt unable to sit due to pain

## 2012-07-18 NOTE — MAU Note (Signed)
Pt states pain has been x3-4 days, gradually worsening. Has cyst on left lower inner portion of labia. Pt tearful, states pain is 10/10.  No prior hx of bartholin's cyst.

## 2012-07-18 NOTE — MAU Provider Note (Signed)
°  History     CSN: [id number]  Arrival date and time: 07/18/12 1224   First Provider Initiated Contact with Patient 07/18/12 1441      Chief Complaint  Patient presents with   Abdominal Pain   vag lump    HPI  31 yo G2P2002 who presents with vaginal "bump" which has progressively gotten bigger and more painful over the last 3-4 days. Pain is rated as 10/10. Pt has not tried any medications for pain relief. Pt also notes associated lower abdominal pain. Pt does shave labial region. No new sexual partners. Pt report remote history of G/C. Pt endorses white vaginal discharge without odor, no itching. No vaginal trauma. Pt notes dysuria.  Pertinent Gynecological History: LMP: 07/12/12 Sexually transmitted diseases: past history: remote G/C   Past Medical History  Diagnosis Date   No pertinent past medical history     Past Surgical History  Procedure Date   Cesarean section    Appendectomy    Cesarean section 2001 & 2009    Family History  Problem Relation Age of Onset   Other Neg Hx     History  Substance Use Topics   Smoking status: Current Everyday Smoker -- 0.5 packs/day    Types: Cigarettes   Smokeless tobacco: Not on file   Alcohol Use: Yes     occassional     Allergies:  Allergies  Allergen Reactions   Penicillins Rash    No prescriptions prior to admission    Review of Systems  Constitutional: Negative for fever and chills.  Eyes: Negative for blurred vision and double vision.  Respiratory: Negative for shortness of breath.   Gastrointestinal: Positive for nausea and abdominal pain. Negative for vomiting, diarrhea and constipation.  Genitourinary: Positive for dysuria. Negative for urgency, frequency and hematuria.  Neurological: Negative for headaches.   Physical Exam   Blood pressure 120/68, pulse 88, temperature 98.1 F (36.7 C), temperature source Oral, resp. rate 20, height 5\' 4"  (1.626 m), weight 77.111 kg (170 lb), last menstrual  period 07/12/2012.  Physical Exam  Constitutional: She is oriented to person, place, and time. She appears well-developed. No distress.  Cardiovascular: Normal rate, regular rhythm, normal heart sounds and intact distal pulses.  Exam reveals no gallop and no friction rub.   No murmur heard. Respiratory: Effort normal and breath sounds normal. No respiratory distress.  GI: Soft. Bowel sounds are normal. She exhibits no distension and no mass. There is tenderness (suprapubic moderate). There is no rebound and no guarding.  Genitourinary:    There is no tenderness or lesion on the right labia. There is tenderness (exquisite) and lesion on the left labia.  Musculoskeletal: Normal range of motion.  Neurological: She is alert and oriented to person, place, and time.  Skin: Skin is warm and dry. No rash noted. No erythema.    MAU Course  Procedures Labial abscess drainage - performed by Lewie Chamber, medical student with Dr. Erin Fulling present. Left labia prepped with betadine; 5 cc lidocaine locally, 1 cm cross incision; 15 mL yellow pustular fluid expelled; packed with 5 cm of 1/4 inch sterile plain packing strip; 4x4 dressing applied. Pt tolerated well.  Assessment and Plan  1. Left labial abscess - I&D performed providing significant relief to pt; pt to follow-up at GYN clinic on Friday with Dr. Erin Fulling. Pt advised to be cautious with washing and to avoid sex/tampons until area is healed.  Latina Craver, PA-S 07/18/2012, 2:57 PM

## 2012-07-18 NOTE — MAU Note (Signed)
Period early and lasted longer than usual

## 2012-07-19 NOTE — MAU Provider Note (Signed)
Escarlet Saathoff L. Harraway-Smith, M.D., FACOG  

## 2012-07-21 ENCOUNTER — Ambulatory Visit (INDEPENDENT_AMBULATORY_CARE_PROVIDER_SITE_OTHER): Payer: Medicaid Other | Admitting: Obstetrics & Gynecology

## 2012-07-21 VITALS — BP 116/71 | HR 78 | Temp 98.1°F | Ht 63.0 in | Wt 168.6 lb

## 2012-07-21 DIAGNOSIS — N764 Abscess of vulva: Secondary | ICD-10-CM

## 2012-07-21 LAB — GC/CHLAMYDIA PROBE AMP, URINE
Chlamydia, Swab/Urine, PCR: NEGATIVE
GC Probe Amp, Urine: POSITIVE — AB

## 2012-07-21 NOTE — Patient Instructions (Addendum)
Sitz Bath A sitz bath is a warm water bath taken in the sitting position that covers only the hips and buttocks. It may be used for either healing or hygiene purposes. Sitz baths are also used to relieve pain, itching, or muscle spasms. The water may contain medicine. Moist heat will help you heal and relax.  HOME CARE INSTRUCTIONS   Fill the bathtub half full with warm water.   Sit in the water and open the drain a little.   Turn on the warm water to keep the tub half full. Keep the water running constantly.   Soak in the water for 15 to 20 minutes.   After the sitz bath, pat the affected area dry first.   Take 3 to 4 sitz baths a day.  SEEK MEDICAL CARE IF:  You get worse instead of better. Stop the sitz baths if you get worse. MAKE SURE YOU:  Understand these instructions.   Will watch your condition.   Will get help right away if you are not doing well or get worse.  Document Released: 09/04/2004 Document Revised: 12/02/2011 Document Reviewed: 03/12/2011 Exit[employer] Patient Information 2012 [employer]. 

## 2012-07-21 NOTE — Progress Notes (Signed)
Subjective:     Patient ID: Tammie Brown, female   DOB: 08/25/1981, 31 y.o.   MRN: [id number]  HPI Pt s/p drainage of Bartholin's gland abscess in  MAU 2-3 days previously.  Wound was packed.  Pt reports pain from packing but, feels much better than prior to initial visit.  Denies fever or chills.  No purulent drainage noted in the past 24 hours.     Review of Systems N/C     Objective:   Physical Exam Pt in NAD GU: EGBUS: Left labia abscess wall still with packing in.  Packing removed.  No purulent drainage noted.  Wound clean.      Assessment:    Vulvar abscess    Plan:     Keep wound clean and dry May take Sitz baths. F/u prn  Jasper Ruminski L. Harraway-Smith, M.D., [place]

## 2012-08-17 ENCOUNTER — Inpatient Hospital Stay (HOSPITAL_COMMUNITY)
Admission: AD | Admit: 2012-08-17 | Discharge: 2012-08-17 | Disposition: A | Discharge status: Home / Self Care | Payer: Medicaid Other | Source: Ambulatory Visit | Attending: Obstetrics & Gynecology | Admitting: Obstetrics & Gynecology

## 2012-08-17 ENCOUNTER — Encounter (HOSPITAL_COMMUNITY): Payer: Self-pay | Admitting: *Deleted

## 2012-08-17 DIAGNOSIS — Z881 Allergy status to other antibiotic agents status: Secondary | ICD-10-CM | POA: Diagnosis present

## 2012-08-17 DIAGNOSIS — A549 Gonococcal infection, unspecified: Secondary | ICD-10-CM | POA: Diagnosis present

## 2012-08-17 DIAGNOSIS — A5400 Gonococcal infection of lower genitourinary tract, unspecified: Secondary | ICD-10-CM | POA: Insufficient documentation

## 2012-08-17 HISTORY — DX: Gonococcal infection, unspecified: A54.9

## 2012-08-17 HISTORY — DX: Urinary tract infection, site not specified: N39.0

## 2012-08-17 HISTORY — DX: Chlamydial infection, unspecified: A74.9

## 2012-08-17 HISTORY — DX: Headache: R51

## 2012-08-17 HISTORY — DX: Acute parametritis and pelvic cellulitis: N73.0

## 2012-08-17 HISTORY — DX: Cardiac murmur, unspecified: R01.1

## 2012-08-17 MED ORDER — CEFTRIAXONE SODIUM 250 MG IJ SOLR
250.0000 mg | INTRAMUSCULAR | Status: AC
  Administered 2012-08-17: 250 mg via INTRAMUSCULAR
  Filled 2012-08-17: qty 250

## 2012-08-17 MED ORDER — AZITHROMYCIN 250 MG PO TABS
1000.0000 mg | ORAL_TABLET | ORAL | Status: AC
  Administered 2012-08-17: 1000 mg via ORAL
  Filled 2012-08-17: qty 4

## 2012-08-17 MED ORDER — DIPHENHYDRAMINE HCL 25 MG PO CAPS
50.0000 mg | ORAL_CAPSULE | Freq: Once | ORAL | Status: AC
  Administered 2012-08-17: 50 mg via ORAL
  Filled 2012-08-17: qty 2

## 2012-08-17 NOTE — MAU Provider Note (Signed)
History     CSN: [id number]  Arrival date and time: 08/17/12 1055   First Provider Initiated Contact with Patient 08/17/12 1124      Chief Complaint  Patient presents with  . Possible Pregnancy  . std treatment    HPI 31 y.o.G2P2002 presents to MAU after getting call from health department about positive Gonorrhea test 1 month ago.  She denies complaints upon arrival but is unsure of her lmp and would like pregnancy test.  She reports mild allergy with itching and no hives to penicillins but has never taken cephalosporins to her knowledge. She denies vaginal bleeding, vaginal itching/burning, urinary symptoms, h/a, dizziness, n/v, or fever/chills.    Approximately 30 min after Rocephin IM dose given, pt reported itching of torso and extremities, with no visible hives.     Past Medical History  Diagnosis Date  . Headache   . Heart murmur   . Urinary tract infection   . PID (acute pelvic inflammatory disease)   . Gonorrhea   . Chlamydia     Past Surgical History  Procedure Date  . Cesarean section   . Appendectomy   . Cesarean section 2001 & 2009    Family History  Problem Relation Age of Onset  . Other Neg Hx   . Hearing loss Neg Hx   . Asthma Mother   . Hypertension Mother   . Diabetes Father   . Cancer Maternal Grandfather     throat    History  Substance Use Topics  . Smoking status: Current Everyday Smoker -- 0.2 packs/day for 15 years    Types: Cigarettes  . Smokeless tobacco: Never Used  . Alcohol Use: Yes     occassional few times a month    Allergies:  Allergies  Allergen Reactions  . Penicillins Rash    No prescriptions prior to admission    Review of Systems  Constitutional: Negative for fever, chills and malaise/fatigue.  Eyes: Negative for blurred vision.  Respiratory: Negative for cough and shortness of breath.   Cardiovascular: Negative for chest pain.  Gastrointestinal: Negative for heartburn, nausea, vomiting, abdominal pain and  diarrhea.  Genitourinary: Negative for dysuria, urgency and frequency.  Musculoskeletal: Negative.   Skin: Positive for itching.  Neurological: Negative for dizziness and headaches.  Psychiatric/Behavioral: Negative for depression.   Physical Exam   Blood pressure 116/54, pulse 67, temperature 97.9 F (36.6 C), temperature source Oral, resp. rate 18, last menstrual period 07/12/2012, SpO2 100.00%.  Physical Exam  Nursing note and vitals reviewed. Constitutional: She is oriented to person, place, and time. She appears well-developed and well-nourished.  Neck: Normal range of motion.  Cardiovascular: Normal rate, regular rhythm and normal heart sounds.   Respiratory: Effort normal and breath sounds normal. No respiratory distress.  GI: Soft.  Musculoskeletal: Normal range of motion.  Neurological: She is alert and oriented to person, place, and time.  Skin: Skin is warm and dry.  Psychiatric: She has a normal mood and affect. Her behavior is normal. Judgment and thought content normal.    MAU Course  Procedures Rocephin 250 mg IM x1 dose with plan to watch pt x1 hour Azithromycin 1 g PO x1 dose Benadryl 50 mg PO x1 dose for itching  Assessment and Plan  Gonorrhea Allergy to cephalosporins with itching  D/C home Signs of worsening allergic reaction reviewed Pt has ride home and will be with other people and not alone for next few hours Encouraged partner treatment at health department Return to  MAU as needed  [place]KIR[place], Camilah Spillman 08/17/2012, 12:29 PM

## 2012-08-17 NOTE — MAU Note (Signed)
Discussed allergic response to penicillin- mainly itching.  NP will proceed.  Signs to watch for explain.  Stressed pt will remain  post injection.

## 2012-08-17 NOTE — MAU Note (Signed)
Was here a month ago, received a call a couple days ago, + GC.  Test when last here was neg, has still not seen period.

## 2013-02-28 ENCOUNTER — Emergency Department (HOSPITAL_COMMUNITY)
Admission: EM | Admit: 2013-02-28 | Discharge: 2013-02-28 | Disposition: A | Discharge status: Home / Self Care | Payer: Medicaid Other | Attending: Emergency Medicine | Admitting: Emergency Medicine

## 2013-02-28 ENCOUNTER — Encounter (HOSPITAL_COMMUNITY): Payer: Self-pay | Admitting: Emergency Medicine

## 2013-02-28 DIAGNOSIS — L02415 Cutaneous abscess of right lower limb: Secondary | ICD-10-CM

## 2013-02-28 DIAGNOSIS — Z87440 Personal history of urinary (tract) infections: Secondary | ICD-10-CM | POA: Insufficient documentation

## 2013-02-28 DIAGNOSIS — R011 Cardiac murmur, unspecified: Secondary | ICD-10-CM | POA: Insufficient documentation

## 2013-02-28 DIAGNOSIS — F17200 Nicotine dependence, unspecified, uncomplicated: Secondary | ICD-10-CM | POA: Insufficient documentation

## 2013-02-28 DIAGNOSIS — Z8742 Personal history of other diseases of the female genital tract: Secondary | ICD-10-CM | POA: Insufficient documentation

## 2013-02-28 DIAGNOSIS — Z8619 Personal history of other infectious and parasitic diseases: Secondary | ICD-10-CM | POA: Insufficient documentation

## 2013-02-28 DIAGNOSIS — L03115 Cellulitis of right lower limb: Secondary | ICD-10-CM

## 2013-02-28 DIAGNOSIS — Z8679 Personal history of other diseases of the circulatory system: Secondary | ICD-10-CM | POA: Insufficient documentation

## 2013-02-28 DIAGNOSIS — L02419 Cutaneous abscess of limb, unspecified: Secondary | ICD-10-CM | POA: Insufficient documentation

## 2013-02-28 MED ORDER — DOXYCYCLINE HYCLATE 100 MG PO CAPS: 100.0000 mg | ORAL_CAPSULE | Freq: Two times a day (BID) | ORAL | Status: DC

## 2013-02-28 MED ORDER — HYDROCODONE-ACETAMINOPHEN 5-325 MG PO TABS: 1.0000 | ORAL_TABLET | Freq: Four times a day (QID) | ORAL | Status: DC | PRN

## 2013-02-28 NOTE — ED Provider Notes (Signed)
History     CSN: [id number]  Arrival date & time 02/28/13  1141   First MD Initiated Contact with Patient 02/28/13 1248      Chief Complaint  Patient presents with  . Abscess    (Consider location/radiation/quality/duration/timing/severity/associated sxs/prior treatment) HPI Tammie Brown is a 32 y.o. female who presents to ED with complaint of an abscess to the right lower leg. States started with a small bump 3 days ago, which she scratched, this morning got worse. States draining purulent drainage. Has not tried any treatment on it. Denies fever, chills, malaise.  Past Medical History  Diagnosis Date  . Headache   . Heart murmur   . Urinary tract infection   . PID (acute pelvic inflammatory disease)   . Gonorrhea   . Chlamydia     Past Surgical History  Procedure Laterality Date  . Cesarean section    . Appendectomy    . Cesarean section  2001 & 2009    Family History  Problem Relation Age of Onset  . Other Neg Hx   . Hearing loss Neg Hx   . Asthma Mother   . Hypertension Mother   . Diabetes Father   . Cancer Maternal Grandfather     throat    History  Substance Use Topics  . Smoking status: Current Every Day Smoker -- 0.25 packs/day for 15 years    Types: Cigarettes  . Smokeless tobacco: Never Used  . Alcohol Use: Yes     Comment: occassional few times a month    OB History   Grav Para Term Preterm Abortions TAB SAB Ect Mult Living   2 2 2       2       Review of Systems  Constitutional: Negative for fever and chills.  Musculoskeletal: Positive for myalgias.  Skin: Positive for color change and wound.  Neurological: Negative for weakness and numbness.    Allergies  Penicillins  Home Medications  No current outpatient prescriptions on file.  BP 117/84  Pulse 78  Temp(Src) 99.3 F (37.4 C) (Oral)  Resp 16  SpO2 100%  LMP 02/06/2013  Physical Exam  Nursing note and vitals reviewed. Constitutional: She is oriented to person, place, and  time. She appears well-developed and well-nourished. No distress.  HENT:  Head: Normocephalic.  Eyes: Conjunctivae are normal.  Neck: Neck supple.  Cardiovascular: Normal rate, regular rhythm and normal heart sounds.   Pulmonary/Chest: Effort normal and breath sounds normal. No respiratory distress. She has no wheezes. She has no rales.  Musculoskeletal: She exhibits no edema.  Neurological: She is alert and oriented to person, place, and time.  Skin: Skin is warm and dry.  3x3cm area of erythema and blistering to the right anterior lower shin. Draining purulent drainage. No induration or fluctuance. Mild surrounding erythema. Tender to palpation.     ED Course  Procedures (including critical care time)    1. Abscess of right lower leg   2. Cellulitis of right lower leg       MDM  Pt with right lower leg lesion, most consistent with draining pustule vs a blister. i do not feel like there is any drainable fluid in the lesion. Will treat with topical and oral antibiotics. Dressing changes. Follow up as needed. PT otherwise non toxic. Afebrile.           Lottie Mussel, PA-C 02/28/13 1612

## 2013-02-28 NOTE — ED Notes (Signed)
Pt reports abscess forming to RLE Sunday night.  Area red, swollen, tender. States hx of same.

## 2013-03-05 NOTE — ED Provider Notes (Signed)
Medical screening examination/treatment/procedure(s) were performed by non-physician practitioner and as supervising physician I was immediately available for consultation/collaboration.   Kevin E Steinl, MD 03/05/13 0745 

## 2013-05-24 ENCOUNTER — Emergency Department (HOSPITAL_COMMUNITY)
Admission: EM | Admit: 2013-05-24 | Discharge: 2013-05-24 | Disposition: A | Discharge status: Home / Self Care | Payer: Medicaid Other | Attending: Emergency Medicine | Admitting: Emergency Medicine

## 2013-05-24 ENCOUNTER — Encounter (HOSPITAL_COMMUNITY): Payer: Self-pay | Admitting: *Deleted

## 2013-05-24 ENCOUNTER — Emergency Department (HOSPITAL_COMMUNITY): Payer: Medicaid Other

## 2013-05-24 DIAGNOSIS — Z3202 Encounter for pregnancy test, result negative: Secondary | ICD-10-CM | POA: Insufficient documentation

## 2013-05-24 DIAGNOSIS — Z87440 Personal history of urinary (tract) infections: Secondary | ICD-10-CM | POA: Insufficient documentation

## 2013-05-24 DIAGNOSIS — R51 Headache: Secondary | ICD-10-CM | POA: Insufficient documentation

## 2013-05-24 DIAGNOSIS — R0989 Other specified symptoms and signs involving the circulatory and respiratory systems: Secondary | ICD-10-CM | POA: Insufficient documentation

## 2013-05-24 DIAGNOSIS — R071 Chest pain on breathing: Secondary | ICD-10-CM | POA: Insufficient documentation

## 2013-05-24 DIAGNOSIS — F17200 Nicotine dependence, unspecified, uncomplicated: Secondary | ICD-10-CM | POA: Insufficient documentation

## 2013-05-24 DIAGNOSIS — R011 Cardiac murmur, unspecified: Secondary | ICD-10-CM | POA: Insufficient documentation

## 2013-05-24 DIAGNOSIS — J020 Streptococcal pharyngitis: Secondary | ICD-10-CM | POA: Insufficient documentation

## 2013-05-24 DIAGNOSIS — R0609 Other forms of dyspnea: Secondary | ICD-10-CM | POA: Insufficient documentation

## 2013-05-24 DIAGNOSIS — M7989 Other specified soft tissue disorders: Secondary | ICD-10-CM | POA: Insufficient documentation

## 2013-05-24 DIAGNOSIS — Z87448 Personal history of other diseases of urinary system: Secondary | ICD-10-CM | POA: Insufficient documentation

## 2013-05-24 DIAGNOSIS — Z8619 Personal history of other infectious and parasitic diseases: Secondary | ICD-10-CM | POA: Insufficient documentation

## 2013-05-24 DIAGNOSIS — R599 Enlarged lymph nodes, unspecified: Secondary | ICD-10-CM | POA: Insufficient documentation

## 2013-05-24 LAB — URINALYSIS, ROUTINE W REFLEX MICROSCOPIC
Hgb urine dipstick: NEGATIVE
Specific Gravity, Urine: 1.03 (ref 1.005–1.030)
Urobilinogen, UA: 1 mg/dL (ref 0.0–1.0)
pH: 6 (ref 5.0–8.0)

## 2013-05-24 LAB — POCT PREGNANCY, URINE: Preg Test, Ur: NEGATIVE

## 2013-05-24 LAB — URINE MICROSCOPIC-ADD ON

## 2013-05-24 LAB — POCT I-STAT, CHEM 8
BUN: 3 mg/dL — ABNORMAL LOW (ref 6–23)
Hemoglobin: 12.9 g/dL (ref 12.0–15.0)
Sodium: 139 mEq/L (ref 135–145)
TCO2: 27 mmol/L (ref 0–100)

## 2013-05-24 LAB — RAPID STREP SCREEN (MED CTR MEBANE ONLY): Streptococcus, Group A Screen (Direct): POSITIVE — AB

## 2013-05-24 MED ORDER — AZITHROMYCIN 250 MG PO TABS: ORAL_TABLET | ORAL | Status: DC

## 2013-05-24 MED ORDER — DEXAMETHASONE SODIUM PHOSPHATE 10 MG/ML IJ SOLN
10.0000 mg | Freq: Once | INTRAMUSCULAR | Status: AC
  Administered 2013-05-24: 10 mg via INTRAMUSCULAR
  Filled 2013-05-24: qty 1

## 2013-05-24 MED ORDER — HYDROCODONE-HOMATROPINE 5-1.5 MG/5ML PO SYRP: 2.5000 mL | ORAL_SOLUTION | Freq: Four times a day (QID) | ORAL | Status: DC | PRN

## 2013-05-24 MED ORDER — IOHEXOL 350 MG/ML SOLN
100.0000 mL | Freq: Once | INTRAVENOUS | Status: AC | PRN
  Administered 2013-05-24: 100 mL via INTRAVENOUS

## 2013-05-24 MED ORDER — KETOROLAC TROMETHAMINE 60 MG/2ML IM SOLN
60.0000 mg | Freq: Once | INTRAMUSCULAR | Status: AC
  Administered 2013-05-24: 60 mg via INTRAMUSCULAR
  Filled 2013-05-24: qty 2

## 2013-05-24 NOTE — ED Notes (Signed)
Pt reports sore throat and HA that started x 2 days ago.  Pt reports fever yesterday of 102-denies meds.

## 2013-05-24 NOTE — ED Provider Notes (Signed)
History     CSN: [id number]  Arrival date & time 05/24/13  1102   First MD Initiated Contact with Patient 05/24/13 1138      Chief Complaint  Patient presents with  . Sore Throat    (Consider location/radiation/quality/duration/timing/severity/associated sxs/prior treatment) The history is provided by the patient. No language interpreter was used.    Tammie Brown is a 32 year old female presents to the emergency department with chief complaint of sore throat.  She reports a peak fever yesterday of 102.  States that she has tender cervical lymphadenopathy, myalgias and arthralgias, decreased oral intake and difficulty swallowing due to odynophagia. The patient denies any difficulty breathing, drooling or change in voice. She has a previous history of strep throat many years ago as a child. Patient c/o HA but denies photophobia, phonophobia, UL throbbing, N/V, visual changes, stiff neck, neck pain, rash, or "thunderclap" onset.  States that it is constant and achy.She c/o R sided chest pain that is pleuritic in nature with associated dyspnea. States that she cannot get comfortable. C/p started 2 days ago with onset of her sxs. She had swelling in the right foot without apparent cause 4 days ago that resolved suddenly. She denies any calf tenderness, hx of dvt or pe. She is a current daily smoker,but denies use of exogenous estrogens. Denies cough or hemoptysis.   Past Medical History  Diagnosis Date  . Headache(784.0)   . Heart murmur   . Urinary tract infection   . PID (acute pelvic inflammatory disease)   . Gonorrhea   . Chlamydia     Past Surgical History  Procedure Laterality Date  . Cesarean section    . Appendectomy    . Cesarean section  2001 & 2009    Family History  Problem Relation Age of Onset  . Other Neg Hx   . Hearing loss Neg Hx   . Asthma Mother   . Hypertension Mother   . Diabetes Father   . Cancer Maternal Grandfather     throat    History   Substance Use Topics  . Smoking status: Current Every Day Smoker -- 0.25 packs/day for 15 years    Types: Cigarettes  . Smokeless tobacco: Never Used  . Alcohol Use: Yes     Comment: occassional few times a month    OB History   Grav Para Term Preterm Abortions TAB SAB Ect Mult Living   2 2 2       2       Review of Systems  Ten systems reviewed and are negative for acute change, except as noted in the HPI.   Allergies  Penicillins  Home Medications  No current outpatient prescriptions on file.  BP 125/77  Pulse 95  Temp(Src) 98.6 F (37 C) (Oral)  Resp 18  Ht 5\' 3"  (1.6 m)  Wt 170 lb (77.111 kg)  BMI 30.12 kg/m2  SpO2 99%  Physical Exam  Constitutional: She is oriented to person, place, and time. She appears well-developed and well-nourished. She appears ill. No distress.  HENT:  Head: Normocephalic and atraumatic.  Right Ear: Hearing and tympanic membrane normal.  Left Ear: Hearing and tympanic membrane normal.  Nose: Nose normal.  Mouth/Throat: Uvula is midline. Edematous present. Oropharyngeal exudate, posterior oropharyngeal edema and posterior oropharyngeal erythema present. No tonsillar abscesses.  Eyes: Conjunctivae are normal. No scleral icterus.  Cardiovascular: Normal rate, regular rhythm and normal heart sounds.  Exam reveals no gallop and no friction rub.  No murmur heard. Pulmonary/Chest: Effort normal and breath sounds normal. No respiratory distress.  Abdominal: Soft. Bowel sounds are normal. She exhibits no distension and no mass. There is no tenderness. There is no guarding.  Lymphadenopathy:    She has cervical adenopathy (tender).  Neurological: She is alert and oriented to person, place, and time.  Skin: Skin is warm and dry. She is not diaphoretic.    ED Course  Procedures (including critical care time)  Labs Reviewed  RAPID STREP SCREEN - Abnormal; Notable for the following:    Streptococcus, Group A Screen (Direct) POSITIVE (*)     All other components within normal limits  D-DIMER, QUANTITATIVE - Abnormal; Notable for the following:    D-Dimer, Quant 0.57 (*)    All other components within normal limits  URINALYSIS, ROUTINE W REFLEX MICROSCOPIC - Abnormal; Notable for the following:    Color, Urine AMBER (*)    APPearance CLOUDY (*)    Leukocytes, UA MODERATE (*)    All other components within normal limits  URINE MICROSCOPIC-ADD ON - Abnormal; Notable for the following:    Squamous Epithelial / LPF FEW (*)    Bacteria, UA FEW (*)    All other components within normal limits  POCT I-STAT, CHEM 8 - Abnormal; Notable for the following:    Potassium 3.2 (*)    BUN 3 (*)    Calcium, Ion 1.25 (*)    All other components within normal limits  URINE CULTURE  POCT PREGNANCY, URINE   No results found.   No diagnosis found.    MDM  Patient with + strep. Patient has hx concerning for possible PE, she has no tachycardia, tachypnea or hemoptysis.  Patient receiving fluids and toradol.      1:06 PM Patient with elevated D-Dimer. Will get CT angio chest to r/o PE. Patient is feeling much beter aftertoradol and fluids.      Patient CT angio negative. Will d/c with azithromycin as patient  has pcn allergy.  The patient appears reasonably screened and/or stabilized for discharge and I doubt any other medical condition or other [facility] requiring further screening, evaluation, or treatment in the ED at this time prior to discharge.   Arthor Captain, PA-C 05/26/13 1914

## 2013-05-24 NOTE — ED Notes (Signed)
Pt talking on phone

## 2013-05-24 NOTE — ED Notes (Signed)
C/O SORETHROAT, HEADACHE AND FEVER SINCE YESTERDAY.

## 2013-05-25 LAB — URINE CULTURE: Colony Count: 85000

## 2013-05-27 NOTE — ED Provider Notes (Signed)
Medical screening examination/treatment/procedure(s) were performed by non-physician practitioner and as supervising physician I was immediately available for consultation/collaboration.    Vida Roller, MD 05/27/13 [phone]

## 2013-08-13 ENCOUNTER — Encounter (HOSPITAL_COMMUNITY): Payer: Self-pay | Admitting: *Deleted

## 2013-08-13 ENCOUNTER — Emergency Department (HOSPITAL_COMMUNITY)
Admission: EM | Admit: 2013-08-13 | Discharge: 2013-08-13 | Disposition: A | Discharge status: Home / Self Care | Payer: Medicaid Other | Attending: Emergency Medicine | Admitting: Emergency Medicine

## 2013-08-13 DIAGNOSIS — A599 Trichomoniasis, unspecified: Secondary | ICD-10-CM | POA: Insufficient documentation

## 2013-08-13 DIAGNOSIS — R300 Dysuria: Secondary | ICD-10-CM | POA: Insufficient documentation

## 2013-08-13 DIAGNOSIS — Z3202 Encounter for pregnancy test, result negative: Secondary | ICD-10-CM | POA: Insufficient documentation

## 2013-08-13 DIAGNOSIS — N898 Other specified noninflammatory disorders of vagina: Secondary | ICD-10-CM

## 2013-08-13 DIAGNOSIS — R319 Hematuria, unspecified: Secondary | ICD-10-CM | POA: Insufficient documentation

## 2013-08-13 DIAGNOSIS — Z880 Allergy status to penicillin: Secondary | ICD-10-CM | POA: Insufficient documentation

## 2013-08-13 DIAGNOSIS — R011 Cardiac murmur, unspecified: Secondary | ICD-10-CM | POA: Insufficient documentation

## 2013-08-13 DIAGNOSIS — Z8619 Personal history of other infectious and parasitic diseases: Secondary | ICD-10-CM | POA: Insufficient documentation

## 2013-08-13 DIAGNOSIS — Z8742 Personal history of other diseases of the female genital tract: Secondary | ICD-10-CM | POA: Insufficient documentation

## 2013-08-13 DIAGNOSIS — Z87440 Personal history of urinary (tract) infections: Secondary | ICD-10-CM | POA: Insufficient documentation

## 2013-08-13 DIAGNOSIS — F17200 Nicotine dependence, unspecified, uncomplicated: Secondary | ICD-10-CM | POA: Insufficient documentation

## 2013-08-13 LAB — WET PREP, GENITAL: Yeast Wet Prep HPF POC: NONE SEEN

## 2013-08-13 LAB — URINALYSIS, ROUTINE W REFLEX MICROSCOPIC
Ketones, ur: NEGATIVE mg/dL
Nitrite: NEGATIVE
Protein, ur: NEGATIVE mg/dL
Urobilinogen, UA: 0.2 mg/dL (ref 0.0–1.0)

## 2013-08-13 LAB — PREGNANCY, URINE: Preg Test, Ur: NEGATIVE

## 2013-08-13 MED ORDER — AZITHROMYCIN 250 MG PO TABS
1000.0000 mg | ORAL_TABLET | Freq: Once | ORAL | Status: AC
  Administered 2013-08-13: 1000 mg via ORAL
  Filled 2013-08-13: qty 4

## 2013-08-13 MED ORDER — METRONIDAZOLE 500 MG PO TABS
2000.0000 mg | ORAL_TABLET | Freq: Once | ORAL | Status: AC
  Administered 2013-08-13: 2000 mg via ORAL
  Filled 2013-08-13: qty 4

## 2013-08-13 MED ORDER — LIDOCAINE HCL (PF) 1 % IJ SOLN
INTRAMUSCULAR | Status: AC
  Administered 2013-08-13: 2 mL
  Filled 2013-08-13: qty 5

## 2013-08-13 MED ORDER — CEFTRIAXONE SODIUM 250 MG IJ SOLR
250.0000 mg | Freq: Once | INTRAMUSCULAR | Status: AC
  Administered 2013-08-13: 250 mg via INTRAMUSCULAR
  Filled 2013-08-13: qty 250

## 2013-08-13 NOTE — ED Provider Notes (Signed)
6:21 PM Went to room multiple times but pt not in the room  Tammie Sprout, MD 08/13/13 1821

## 2013-08-13 NOTE — ED Notes (Signed)
Pt reports white vaginal discharge for last couple of days and has pain with urination.  Pt has knot to belly button that comes up when she stretches.  LMP 08/05/13

## 2013-08-13 NOTE — ED Provider Notes (Signed)
CSN: [id number]     Arrival date & time 08/13/13  1726 History     First MD Initiated Contact with Patient 08/13/13 1747     Chief Complaint  Patient presents with  . Vaginal Discharge   (Consider location/radiation/quality/duration/timing/severity/associated sxs/prior Treatment) The history is provided by the patient and medical records.   Patient presents to the ED for new purulent vaginal discharge x3 days. Upon arrival to the ED she began experiencing dysuria and hematuria. Patient denies any new sexual partners, states she's had one female partner for the past 4 years.  Patient has a history of Gonorrhea and Chlamydia.  Denies any fevers, sweats, or chills.  No abdominal pain or flank pain.  Denies possibility of pregnancy at this time.  Past Medical History  Diagnosis Date  . Headache(784.0)   . Heart murmur   . Urinary tract infection   . PID (acute pelvic inflammatory disease)   . Gonorrhea   . Chlamydia    Past Surgical History  Procedure Laterality Date  . Cesarean section    . Appendectomy    . Cesarean section  2001 & 2009   Family History  Problem Relation Age of Onset  . Other Neg Hx   . Hearing loss Neg Hx   . Asthma Mother   . Hypertension Mother   . Diabetes Father   . Cancer Maternal Grandfather     throat   History  Substance Use Topics  . Smoking status: Current Every Day Smoker -- 0.25 packs/day for 15 years    Types: Cigarettes  . Smokeless tobacco: Never Used  . Alcohol Use: Yes     Comment: occassional few times a month   OB History   Grav Para Term Preterm Abortions TAB SAB Ect Mult Living   2 2 2       2      Review of Systems  Genitourinary: Positive for dysuria and vaginal discharge.  All other systems reviewed and are negative.    Allergies  Penicillins  Home Medications   Current Outpatient Rx  Name  Route  Sig  Dispense  Refill  . azithromycin (ZITHROMAX Z-PAK) 250 MG tablet      2 po day one, then 1 daily x 4 days   5  tablet   0   . HYDROcodone-homatropine (HYCODAN) 5-1.5 MG/5ML syrup   Oral   Take 2.5 mLs by mouth every 6 (six) hours as needed for cough.   120 mL   0    BP 140/64  Pulse 69  Temp(Src) 98.1 F (36.7 C) (Oral)  Resp 18  SpO2 99%  LMP 08/05/2013  Physical Exam  Nursing note and vitals reviewed. Constitutional: She is oriented to person, place, and time. She appears well-developed and well-nourished.  HENT:  Head: Normocephalic and atraumatic.  Mouth/Throat: Oropharynx is clear and moist.  Eyes: Conjunctivae and EOM are normal. Pupils are equal, round, and reactive to light.  Neck: Normal range of motion. Neck supple.  Cardiovascular: Normal rate, regular rhythm and normal heart sounds.   Pulmonary/Chest: Effort normal and breath sounds normal.  Abdominal: Soft. Bowel sounds are normal. There is no tenderness. There is no guarding.  Genitourinary: There is no lesion on the right labia. There is no lesion on the left labia. Cervix exhibits no motion tenderness. Right adnexum displays no mass and no tenderness. Left adnexum displays no mass and no tenderness. Vaginal discharge found.  Speculum- healthy appearing vaginal tissue, copious purulent vaginal discharge without  odor  Bimanual- no adnexal or CMT  Musculoskeletal: Normal range of motion.  Neurological: She is alert and oriented to person, place, and time.  Skin: Skin is warm and dry.  Psychiatric: She has a normal mood and affect.    ED Course   Procedures (including critical care time)  Labs Reviewed  URINALYSIS, ROUTINE W REFLEX MICROSCOPIC - Abnormal; Notable for the following:    APPearance HAZY (*)    Hgb urine dipstick MODERATE (*)    Leukocytes, UA MODERATE (*)    All other components within normal limits  URINE MICROSCOPIC-ADD ON - Abnormal; Notable for the following:    Squamous Epithelial / LPF FEW (*)    All other components within normal limits  GC/CHLAMYDIA PROBE AMP  WET PREP, GENITAL  PREGNANCY,  URINE   No results found.  1. Trichomoniasis   2. Vaginal discharge   3. Dysuria     MDM   U/a without signs of infection-- trich present, 2g flagyl given.  Pelvic exam with copious purulent vaginal d/c but no adnexal or CMT.  Given sx and pts hx will tx ppx for gonorrhea and chlamydia.  Informed that she will be notified if culture results are abnormal. [facility]  Discussed plan with pt, she agreed.  Return precautions advised.  Garlon Hatchet, PA-C 08/13/13 [phone]

## 2013-08-13 NOTE — ED Notes (Signed)
Pt discharged.Vital signs stable and GCS 15 

## 2013-08-14 LAB — GC/CHLAMYDIA PROBE AMP: GC Probe RNA: NEGATIVE

## 2013-08-14 NOTE — ED Provider Notes (Signed)
Medical screening examination/treatment/procedure(s) were performed by non-physician practitioner and as supervising physician I was immediately available for consultation/collaboration.   Gwyneth Sprout, MD 08/14/13 [phone]

## 2013-11-28 ENCOUNTER — Emergency Department (HOSPITAL_COMMUNITY)
Admission: EM | Admit: 2013-11-28 | Discharge: 2013-11-28 | Disposition: A | Discharge status: Home / Self Care | Payer: Medicaid Other | Attending: Emergency Medicine | Admitting: Emergency Medicine

## 2013-11-28 ENCOUNTER — Emergency Department (HOSPITAL_COMMUNITY): Payer: Medicaid Other

## 2013-11-28 ENCOUNTER — Encounter (HOSPITAL_COMMUNITY): Payer: Self-pay | Admitting: Emergency Medicine

## 2013-11-28 DIAGNOSIS — Z3202 Encounter for pregnancy test, result negative: Secondary | ICD-10-CM | POA: Insufficient documentation

## 2013-11-28 DIAGNOSIS — Z8719 Personal history of other diseases of the digestive system: Secondary | ICD-10-CM

## 2013-11-28 DIAGNOSIS — R011 Cardiac murmur, unspecified: Secondary | ICD-10-CM | POA: Insufficient documentation

## 2013-11-28 DIAGNOSIS — Z880 Allergy status to penicillin: Secondary | ICD-10-CM | POA: Insufficient documentation

## 2013-11-28 DIAGNOSIS — F17200 Nicotine dependence, unspecified, uncomplicated: Secondary | ICD-10-CM | POA: Insufficient documentation

## 2013-11-28 DIAGNOSIS — R079 Chest pain, unspecified: Secondary | ICD-10-CM | POA: Insufficient documentation

## 2013-11-28 DIAGNOSIS — R12 Heartburn: Secondary | ICD-10-CM | POA: Insufficient documentation

## 2013-11-28 DIAGNOSIS — Z87440 Personal history of urinary (tract) infections: Secondary | ICD-10-CM | POA: Insufficient documentation

## 2013-11-28 DIAGNOSIS — Z8619 Personal history of other infectious and parasitic diseases: Secondary | ICD-10-CM | POA: Insufficient documentation

## 2013-11-28 DIAGNOSIS — N898 Other specified noninflammatory disorders of vagina: Secondary | ICD-10-CM | POA: Insufficient documentation

## 2013-11-28 DIAGNOSIS — D649 Anemia, unspecified: Secondary | ICD-10-CM | POA: Insufficient documentation

## 2013-11-28 LAB — URINALYSIS, ROUTINE W REFLEX MICROSCOPIC
Bilirubin Urine: NEGATIVE
Glucose, UA: NEGATIVE mg/dL
Hgb urine dipstick: NEGATIVE
Protein, ur: NEGATIVE mg/dL
Specific Gravity, Urine: 1.02 (ref 1.005–1.030)
Urobilinogen, UA: 0.2 mg/dL (ref 0.0–1.0)
pH: 7 (ref 5.0–8.0)

## 2013-11-28 LAB — COMPREHENSIVE METABOLIC PANEL
ALT: 11 U/L (ref 0–35)
AST: 21 U/L (ref 0–37)
Albumin: 3.6 g/dL (ref 3.5–5.2)
Alkaline Phosphatase: 45 U/L (ref 39–117)
BUN: 11 mg/dL (ref 6–23)
CO2: 24 mEq/L (ref 19–32)
Chloride: 105 mEq/L (ref 96–112)
GFR calc non Af Amer: 72 mL/min — ABNORMAL LOW (ref >90)
Potassium: 4.1 mEq/L (ref 3.5–5.1)
Sodium: 138 mEq/L (ref 135–145)
Total Bilirubin: 0.4 mg/dL (ref 0.3–1.2)
Total Protein: 7.2 g/dL (ref 6.0–8.3)

## 2013-11-28 LAB — POCT I-STAT TROPONIN I: Troponin i, poc: 0 ng/mL (ref 0.00–0.08)

## 2013-11-28 LAB — CBC
HCT: 30.5 % — ABNORMAL LOW (ref 36.0–46.0)
MCV: 73.5 fL — ABNORMAL LOW (ref 78.0–100.0)
Platelets: 282 10*3/uL (ref 150–400)
RBC: 4.15 MIL/uL (ref 3.87–5.11)
RDW: 17.3 % — ABNORMAL HIGH (ref 11.5–15.5)
WBC: 6.3 10*3/uL (ref 4.0–10.5)

## 2013-11-28 LAB — URINE MICROSCOPIC-ADD ON

## 2013-11-28 LAB — PREGNANCY, URINE: Preg Test, Ur: NEGATIVE

## 2013-11-28 MED ORDER — FERROUS SULFATE 325 (65 FE) MG PO TABS: 325.0000 mg | ORAL_TABLET | Freq: Every day | ORAL | Status: DC

## 2013-11-28 MED ORDER — GI COCKTAIL ~~LOC~~
30.0000 mL | Freq: Once | ORAL | Status: AC
  Administered 2013-11-28: 30 mL via ORAL
  Filled 2013-11-28: qty 30

## 2013-11-28 MED ORDER — RANITIDINE HCL 150 MG PO CAPS: 150.0000 mg | ORAL_CAPSULE | Freq: Every day | ORAL | Status: DC

## 2013-11-28 NOTE — ED Notes (Signed)
Pt reports right sided chest pain x 2-3 days. Reports pain worse with deep breath. Reports shortness of breath. States last time she was here, they found a "spot" on her lung. Wants to have that rechecked. Pain radiates all down right side to knee.

## 2013-11-28 NOTE — ED Provider Notes (Signed)
CSN: [id number]     Arrival date & time 11/28/13  1055 History   First MD Initiated Contact with Patient 11/28/13 1123     Chief Complaint  Patient presents with  . Chest Pain  . Possible Pregnancy  . Vaginal Discharge   (Consider location/radiation/quality/duration/timing/severity/associated sxs/prior Treatment) Patient is a 32 y.o. female presenting with chest pain. The history is provided by the patient. No language interpreter was used.  Chest Pain Pain location:  Epigastric Pain quality: burning   Pain radiates to the back: no   Pain severity:  No pain Progression:  Waxing and waning Context: no movement, not raising an arm, no stress and no trauma   Associated symptoms: heartburn   Associated symptoms: no abdominal pain, no cough, no fever, no nausea, no palpitations, no shortness of breath and not vomiting    Pt is a 32 year old, well-appearing, female who presents today with a history of chest discomfort for the last 2-3 days. She describes this as burning pain that comes and goes. She denies fever, chills, difficulty breathing or shortness of breath. She denies any discomfort when taking a deep breath and has not had any recent illness. She denies any history of PE, DVT or cardiac problems. No known family cardiac problems. She denies any problems swallowing and has been eating and drinking in her normal pattern, No N/V/D. Shed denies any dysuria, frequency or other urinary symptoms. She denies any vaginal discharge. She has reported that she had two periods in November approx one week apart. Her usual periods are every 28-30 days with a 5-6 day duration. She denies any abdominal or pelvic pain. Reports heartburn and reflux frequently and does not currently take any meds for GERD.    Past Medical History  Diagnosis Date  . Headache(784.0)   . Heart murmur   . Urinary tract infection   . PID (acute pelvic inflammatory disease)   . Gonorrhea   . Chlamydia    Past Surgical  History  Procedure Laterality Date  . Cesarean section    . Appendectomy    . Cesarean section  2001 & 2009   Family History  Problem Relation Age of Onset  . Other Neg Hx   . Hearing loss Neg Hx   . Asthma Mother   . Hypertension Mother   . Diabetes Father   . Cancer Maternal Grandfather     throat   History  Substance Use Topics  . Smoking status: Current Every Day Smoker -- 0.25 packs/day for 15 years    Types: Cigarettes  . Smokeless tobacco: Never Used  . Alcohol Use: Yes     Comment: occassional few times a month   OB History   Grav Para Term Preterm Abortions TAB SAB Ect Mult Living   2 2 2       2      Review of Systems  Constitutional: Negative for fever and chills.  Respiratory: Negative for cough, chest tightness, shortness of breath and wheezing.   Cardiovascular: Positive for chest pain. Negative for palpitations.  Gastrointestinal: Positive for heartburn. Negative for nausea, vomiting, abdominal pain, diarrhea, constipation and blood in stool.  Genitourinary: Negative for dysuria, urgency, vaginal bleeding, vaginal discharge, vaginal pain and pelvic pain.  All other systems reviewed and are negative.    Allergies  Penicillins  Home Medications  No current outpatient prescriptions on file. BP 110/57  Pulse 66  Temp(Src) 97.8 F (36.6 C) (Oral)  Resp 16  SpO2 99%  LMP 11/07/2013 Physical Exam  Nursing note and vitals reviewed. Constitutional: She is oriented to person, place, and time. She appears well-developed and well-nourished.  HENT:  Head: Normocephalic and atraumatic.  Mouth/Throat: Oropharynx is clear and moist.  Eyes: Conjunctivae and EOM are normal. Pupils are equal, round, and reactive to light.  Neck: Normal range of motion. Neck supple.  Cardiovascular: Normal rate, regular rhythm, normal heart sounds and intact distal pulses.   Pulmonary/Chest: Effort normal and breath sounds normal. No respiratory distress. She has no wheezes. She  has no rales. She exhibits no tenderness.  Abdominal: Soft. Bowel sounds are normal. She exhibits no distension. There is no hepatosplenomegaly. There is no tenderness. There is no rebound, no guarding and no CVA tenderness.  Musculoskeletal: Normal range of motion.  Neurological: She is alert and oriented to person, place, and time.  Skin: Skin is warm and dry.  Psychiatric: She has a normal mood and affect. Her behavior is normal. Judgment and thought content normal.    ED Course  Procedures (including critical care time) Labs Review Labs Reviewed  CBC - Abnormal; Notable for the following:    Hemoglobin 9.3 (*)    HCT 30.5 (*)    MCV 73.5 (*)    MCH 22.4 (*)    RDW 17.3 (*)    All other components within normal limits  URINALYSIS, ROUTINE W REFLEX MICROSCOPIC - Abnormal; Notable for the following:    Leukocytes, UA SMALL (*)    All other components within normal limits  URINE MICROSCOPIC-ADD ON - Abnormal; Notable for the following:    Squamous Epithelial / LPF FEW (*)    All other components within normal limits  PREGNANCY, URINE  COMPREHENSIVE METABOLIC PANEL  POCT I-STAT TROPONIN I   Imaging Review Dg Chest 2 View  11/28/2013   CLINICAL DATA:  Chest pain.  EXAM: CHEST  2 VIEW  COMPARISON:  Chest CT 05/24/2013.  FINDINGS: The heart size and mediastinal contours are within normal limits. Both lungs are clear. The visualized skeletal structures are unremarkable.  IMPRESSION: No active cardiopulmonary disease.   Electronically Signed   By: Maisie Fus  Register   On: 11/28/2013 12:22    EKG Interpretation    Date/Time:  Wednesday November 28 2013 11:06:59 EST Ventricular Rate:  64 PR Interval:  130 QRS Duration: 68 QT Interval:  406 QTC Calculation: 418 R Axis:   63 Text Interpretation:  Normal sinus rhythm Nonspecific T wave abnormality Abnormal ECG Confirmed by DELOS  MD, DOUGLAS ([id number]) on 11/28/2013 12:54:01 PM            MDM   1. Chest pain   2. H/O  gastroesophageal reflux (GERD)   3. Anemia    Pt reports much relief of symptoms after GI cocktail. H/O GERD without taking any meds, also eats spicy foods and spaghetti sauce and things that she knows are triggers for her heartburn. Unremarkable EKG, negative troponin. CBC, anemia, she is not sure if she has been worked up for this before. May be due to heavy periods. Ferrous Sulfate prescription given and follow-up with [facility] for re-evaluation in 3-4 weeks. Zantac prescription and diet modification instructions given. Lengthy discussion about GERD and management. No shortness of breath, difficulty breathing or history of PE or Cardiac. Low suspicion of PE or cardiac etiology. Pt is well-appearing and had complete resolution of symptoms after GI cocktail.      Irish Elders, NP 11/28/13 1404  Irish Elders, NP 11/28/13 1406

## 2013-11-28 NOTE — ED Provider Notes (Signed)
Medical screening examination/treatment/procedure(s) were performed by non-physician practitioner and as supervising physician I was immediately available for consultation/collaboration.  EKG Interpretation    Date/Time:  Wednesday November 28 2013 11:06:59 EST Ventricular Rate:  64 PR Interval:  130 QRS Duration: 68 QT Interval:  406 QTC Calculation: 418 R Axis:   63 Text Interpretation:  Normal sinus rhythm Nonspecific T wave abnormality Abnormal ECG Confirmed by Malva Cogan  MD, Sadako Cegielski ([id number]) on 11/28/2013 12:54:01 PM             Geoffery Lyons, MD 11/28/13 2000

## 2014-01-24 ENCOUNTER — Encounter (HOSPITAL_COMMUNITY): Payer: Self-pay | Admitting: Emergency Medicine

## 2014-01-24 ENCOUNTER — Emergency Department (HOSPITAL_COMMUNITY)
Admission: EM | Admit: 2014-01-24 | Discharge: 2014-01-24 | Disposition: A | Discharge status: Home / Self Care | Payer: Medicaid Other | Attending: Emergency Medicine | Admitting: Emergency Medicine

## 2014-01-24 DIAGNOSIS — Z79899 Other long term (current) drug therapy: Secondary | ICD-10-CM | POA: Insufficient documentation

## 2014-01-24 DIAGNOSIS — Z87440 Personal history of urinary (tract) infections: Secondary | ICD-10-CM | POA: Insufficient documentation

## 2014-01-24 DIAGNOSIS — Z8742 Personal history of other diseases of the female genital tract: Secondary | ICD-10-CM | POA: Insufficient documentation

## 2014-01-24 DIAGNOSIS — Z8619 Personal history of other infectious and parasitic diseases: Secondary | ICD-10-CM | POA: Insufficient documentation

## 2014-01-24 DIAGNOSIS — J029 Acute pharyngitis, unspecified: Secondary | ICD-10-CM | POA: Insufficient documentation

## 2014-01-24 DIAGNOSIS — R011 Cardiac murmur, unspecified: Secondary | ICD-10-CM | POA: Insufficient documentation

## 2014-01-24 DIAGNOSIS — F17200 Nicotine dependence, unspecified, uncomplicated: Secondary | ICD-10-CM | POA: Insufficient documentation

## 2014-01-24 DIAGNOSIS — Z880 Allergy status to penicillin: Secondary | ICD-10-CM | POA: Insufficient documentation

## 2014-01-24 LAB — RAPID STREP SCREEN (MED CTR MEBANE ONLY): STREPTOCOCCUS, GROUP A SCREEN (DIRECT): NEGATIVE

## 2014-01-24 MED ORDER — MAGIC MOUTHWASH W/LIDOCAINE: 5.0000 mL | Freq: Four times a day (QID) | ORAL | Status: DC | PRN

## 2014-01-24 MED ORDER — IBUPROFEN 400 MG PO TABS
800.0000 mg | ORAL_TABLET | Freq: Once | ORAL | Status: AC
Start: 2014-01-24 — End: 2014-01-24
  Administered 2014-01-24: 800 mg via ORAL
  Filled 2014-01-24: qty 2

## 2014-01-24 MED ORDER — IBUPROFEN 800 MG PO TABS: 800.0000 mg | ORAL_TABLET | Freq: Four times a day (QID) | ORAL | Status: DC | PRN

## 2014-01-24 MED ORDER — MAGIC MOUTHWASH
5.0000 mL | Freq: Once | ORAL | Status: DC
  Filled 2014-01-24: qty 5

## 2014-01-24 MED ORDER — MAGIC MOUTHWASH
15.0000 mL | Freq: Once | ORAL | Status: AC
  Administered 2014-01-24: 15 mL via ORAL

## 2014-01-24 NOTE — ED Provider Notes (Signed)
CSN: [id number]     Arrival date & time 01/24/14  5009 History   First MD Initiated Contact with Patient 01/24/14 0954    This chart was scribed for Fransisco Beau PA-C, a non-physician practitioner working with No att. providers found by Denice Bors, ED Scribe. This patient was seen in room TR05C/TR05C and the patient's care was started at 11:28 AM     Chief Complaint  Patient presents with  . Sore Throat   (Consider location/radiation/quality/duration/timing/severity/associated sxs/prior Treatment) The history is provided by the patient. No language interpreter was used.   HPI Comments: VERTIS BAUDER is a 33 y.o. female who presents to the Emergency Department complaining of constant moderate sore throat onset yesterday morning. Describes sore throat as worsening. Reports pain is exacerbated by swallowing and alleviated by cold drinks. No medications PTA. Denies associated dysphagia, difficulty breathing, cough, fever, emesis, and rhinorrhea.    Past Medical History  Diagnosis Date  . Headache(784.0)   . Heart murmur   . Urinary tract infection   . PID (acute pelvic inflammatory disease)   . Gonorrhea   . Chlamydia    Past Surgical History  Procedure Laterality Date  . Cesarean section    . Appendectomy    . Cesarean section  2001 & 2009   Family History  Problem Relation Age of Onset  . Other Neg Hx   . Hearing loss Neg Hx   . Asthma Mother   . Hypertension Mother   . Diabetes Father   . Cancer Maternal Grandfather     throat   History  Substance Use Topics  . Smoking status: Current Every Day Smoker -- 0.25 packs/day for 15 years    Types: Cigarettes  . Smokeless tobacco: Never Used  . Alcohol Use: Yes     Comment: occassional few times a month   OB History   Grav Para Term Preterm Abortions TAB SAB Ect Mult Living   2 2 2       2      Review of Systems  Constitutional: Negative for fever.  HENT: Positive for sore throat. Negative for rhinorrhea  and trouble swallowing.   Respiratory: Negative for cough.   Gastrointestinal: Negative for vomiting.  Psychiatric/Behavioral: Negative for confusion.    Allergies  Penicillins  Home Medications   Current Outpatient Rx  Name  Route  Sig  Dispense  Refill  . ferrous sulfate 325 (65 FE) MG tablet   Oral   Take 1 tablet (325 mg total) by mouth daily.   30 tablet   0   . ranitidine (ZANTAC) 150 MG capsule   Oral   Take 1 capsule (150 mg total) by mouth daily.   30 capsule   0   . Alum & Mag Hydroxide-Simeth (MAGIC MOUTHWASH W/LIDOCAINE) SOLN   Oral   Take 5 mLs by mouth 4 (four) times daily as needed for mouth pain.   30 mL   0   . ibuprofen (ADVIL,MOTRIN) 800 MG tablet   Oral   Take 1 tablet (800 mg total) by mouth every 6 (six) hours as needed for fever, mild pain or moderate pain.   30 tablet   0    BP 117/64  Pulse 71  Temp(Src) 97 F (36.1 C) (Oral)  Resp 18  SpO2 99% Physical Exam  Constitutional: She is oriented to person, place, and time. She appears well-developed and well-nourished. No distress.  HENT:  Head: Normocephalic and atraumatic.  Right Ear: External ear normal.  Left Ear: External ear normal.  Nose: Nose normal.  Mouth/Throat: Uvula is midline and mucous membranes are normal. No trismus in the jaw. No uvula swelling. Posterior oropharyngeal erythema present. No oropharyngeal exudate, posterior oropharyngeal edema or tonsillar abscesses.  Eyes: Conjunctivae are normal.  Neck: Normal range of motion and full passive range of motion without pain. Neck supple.  Cardiovascular: Normal rate and regular rhythm.   Pulmonary/Chest: Effort normal and breath sounds normal. No respiratory distress. She has no wheezes. She has no rales.  Abdominal: Soft. Bowel sounds are normal. There is no tenderness.  Musculoskeletal: Normal range of motion.  Neurological: She is alert and oriented to person, place, and time.  Skin: Skin is warm and dry. She is not  diaphoretic.  Psychiatric: She has a normal mood and affect.    ED Course  Procedures  Medications  ibuprofen (ADVIL,MOTRIN) tablet 800 mg (800 mg Oral Given 01/24/14 1037)  magic mouthwash (15 mLs Oral Given 01/24/14 1040)    COORDINATION OF CARE:  Nursing notes reviewed. Vital signs reviewed. Initial pt interview and examination performed.   10:07 AM-Discussed work up plan with pt at bedside, which includes rapid strep screen. Pt agrees with plan.  10:21 AM Nursing Notes Reviewed/ Care Coordinated Interpretation of Laboratory Data incorporated into ED treatment Discussed results and treatment plan with pt. Pt demonstrates understanding and agrees with plan.   Treatment plan initiated: Medications  ibuprofen (ADVIL,MOTRIN) tablet 800 mg (800 mg Oral Given 01/24/14 1037)  magic mouthwash (15 mLs Oral Given 01/24/14 1040)     Initial diagnostic testing ordered.    Labs Review Labs Reviewed  RAPID STREP SCREEN  CULTURE, GROUP A STREP   Imaging Review No results found.  EKG Interpretation   None       MDM   1. Viral pharyngitis    Filed Vitals:   01/24/14 0945  BP: 117/64  Pulse: 71  Temp: 97 F (36.1 C)  Resp: 18    Afebrile, NAD, non-toxic appearing, AAOx4.   Pt afebrile with tonsillar exudate, negative strep. Presents with mild cervical lymphadenopathy, & dysphagia; diagnosis of viral pharyngitis. No abx indicated. DC w symptomatic tx for pain  Pt does not appear dehydrated, but did discuss importance of water rehydration. Presentation non concerning for PTA or infxn spread to soft tissue. No trismus or uvula deviation. Specific return precautions discussed. Pt able to drink water in ED without difficulty with intact air way. Recommended PCP follow up.   I personally performed the services described in this documentation, which was scribed in my presence. The recorded information has been reviewed and is accurate.       Harlow Mares,  PA-C 01/24/14 1129

## 2014-01-24 NOTE — ED Provider Notes (Signed)
Medical screening examination/treatment/procedure(s) were performed by non-physician practitioner and as supervising physician I was immediately available for consultation/collaboration.  EKG Interpretation   None         Jurnei Latini B. Karle Starch, MD 01/24/14 1327

## 2014-01-24 NOTE — Discharge Instructions (Signed)
Please follow up with your primary care physician in 1-2 days. If you do not have one please call the [facility] number listed above. Please use medications as prescribed. Please read all discharge instructions and return precautions.   Viral Pharyngitis Viral pharyngitis is a viral infection that produces redness, pain, and swelling (inflammation) of the throat. It can spread from person to person (contagious). CAUSES Viral pharyngitis is caused by inhaling a large amount of certain germs called viruses. Many different viruses cause viral pharyngitis. SYMPTOMS Symptoms of viral pharyngitis include:  Sore throat.  Tiredness.  Stuffy nose.  Low-grade fever.  Congestion.  Cough. TREATMENT Treatment includes rest, drinking plenty of fluids, and the use of over-the-counter medication (approved by your caregiver). HOME CARE INSTRUCTIONS   Drink enough fluids to keep your urine clear or pale yellow.  Eat soft, cold foods such as ice cream, frozen ice pops, or gelatin dessert.  Gargle with warm salt water (1 tsp salt per 1 qt of water).  If over age 76, throat lozenges may be used safely.  Only take over-the-counter or prescription medicines for pain, discomfort, or fever as directed by your caregiver. Do not take aspirin. To help prevent spreading viral pharyngitis to others, avoid:  Mouth-to-mouth contact with others.  Sharing utensils for eating and drinking.  Coughing around others. SEEK MEDICAL CARE IF:   You are better in a few days, then become worse.  You have a fever or pain not helped by pain medicines.  There are any other changes that concern you. Document Released: 09/22/2005 Document Revised: 03/06/2012 Document Reviewed: 02/18/2011 [facility] Patient Information 2014 [place], [place].

## 2014-01-24 NOTE — ED Notes (Signed)
Pt here from home with c/o sore throat a, throat is red and color and it hurts to swallow

## 2014-01-26 LAB — CULTURE, GROUP A STREP

## 2014-04-08 ENCOUNTER — Inpatient Hospital Stay (HOSPITAL_COMMUNITY)
Admission: AD | Admit: 2014-04-08 | Discharge: 2014-04-08 | Disposition: A | Discharge status: Home / Self Care | Payer: Medicaid Other | Source: Ambulatory Visit | Attending: Obstetrics & Gynecology | Admitting: Obstetrics & Gynecology

## 2014-04-08 ENCOUNTER — Encounter (HOSPITAL_COMMUNITY): Payer: Self-pay | Admitting: *Deleted

## 2014-04-08 DIAGNOSIS — Z309 Encounter for contraceptive management, unspecified: Secondary | ICD-10-CM

## 2014-04-08 DIAGNOSIS — F17200 Nicotine dependence, unspecified, uncomplicated: Secondary | ICD-10-CM

## 2014-04-08 DIAGNOSIS — R109 Unspecified abdominal pain: Secondary | ICD-10-CM

## 2014-04-08 DIAGNOSIS — R51 Headache: Secondary | ICD-10-CM | POA: Insufficient documentation

## 2014-04-08 LAB — URINALYSIS, ROUTINE W REFLEX MICROSCOPIC
BILIRUBIN URINE: NEGATIVE
Glucose, UA: NEGATIVE mg/dL
KETONES UR: 15 mg/dL — AB
Leukocytes, UA: NEGATIVE
NITRITE: NEGATIVE
Protein, ur: NEGATIVE mg/dL
Urobilinogen, UA: 0.2 mg/dL (ref 0.0–1.0)
pH: 6 (ref 5.0–8.0)

## 2014-04-08 LAB — URINE MICROSCOPIC-ADD ON

## 2014-04-08 LAB — WET PREP, GENITAL
CLUE CELLS WET PREP: NONE SEEN
Trich, Wet Prep: NONE SEEN
Yeast Wet Prep HPF POC: NONE SEEN

## 2014-04-08 LAB — HCG, QUANTITATIVE, PREGNANCY: hCG, Beta Chain, Quant, S: <1 m[IU]/mL (ref <5)

## 2014-04-08 MED ORDER — IBUPROFEN 800 MG PO TABS: 800.0000 mg | ORAL_TABLET | Freq: Three times a day (TID) | ORAL | Status: DC

## 2014-04-08 MED ORDER — PROMETHAZINE HCL 25 MG PO TABS: 25.0000 mg | ORAL_TABLET | Freq: Four times a day (QID) | ORAL | Status: DC | PRN

## 2014-04-08 MED ORDER — KETOROLAC TROMETHAMINE 60 MG/2ML IM SOLN
60.0000 mg | Freq: Once | INTRAMUSCULAR | Status: AC
  Administered 2014-04-08: 60 mg via INTRAMUSCULAR
  Filled 2014-04-08: qty 2

## 2014-04-08 MED ORDER — NORGESTIMATE-ETH ESTRADIOL 0.25-35 MG-MCG PO TABS: 1.0000 | ORAL_TABLET | Freq: Every day | ORAL | Status: DC

## 2014-04-08 MED ORDER — LEVONORGESTREL 0.75 MG PO TABS: 0.7500 mg | ORAL_TABLET | Freq: Two times a day (BID) | ORAL | Status: DC

## 2014-04-08 NOTE — Discharge Instructions (Signed)
Abdominal Pain During Pregnancy Abdominal pain is common in pregnancy. Most of the time, it does not cause harm. There are many causes of abdominal pain. Some causes are more serious than others. Some of the causes of abdominal pain in pregnancy are easily diagnosed. Occasionally, the diagnosis takes time to understand. Other times, the cause is not determined. Abdominal pain can be a sign that something is very wrong with the pregnancy, or the pain may have nothing to do with the pregnancy at all. For this reason, always tell your health care provider if you have any abdominal discomfort. HOME CARE INSTRUCTIONS  Monitor your abdominal pain for any changes. The following actions may help to alleviate any discomfort you are experiencing:  Do not have sexual intercourse or put anything in your vagina until your symptoms go away completely.  Get plenty of rest until your pain improves.  Drink clear fluids if you feel nauseous. Avoid solid food as long as you are uncomfortable or nauseous.  Only take over-the-counter or prescription medicine as directed by your health care provider.  Keep all follow-up appointments with your health care provider. SEEK IMMEDIATE MEDICAL CARE IF:  You are bleeding, leaking fluid, or passing tissue from the vagina.  You have increasing pain or cramping.  You have persistent vomiting.  You have painful or bloody urination.  You have a fever.  You notice a decrease in your baby's movements.  You have extreme weakness or feel faint.  You have shortness of breath, with or without abdominal pain.  You develop a severe headache with abdominal pain.  You have abnormal vaginal discharge with abdominal pain.  You have persistent diarrhea.  You have abdominal pain that continues even after rest, or gets worse. MAKE SURE YOU:   Understand these instructions.  Will watch your condition.  Will get help right away if you are not doing well or get  worse. Document Released: 12/13/2005 Document Revised: 10/03/2013 Document Reviewed: 07/12/2013 [facility] Patient Information 2014 [place], [place]. Contraception Choices Contraception (birth control) is the use of any methods or devices to prevent pregnancy. Below are some methods to help avoid pregnancy. HORMONAL METHODS   Contraceptive implant This is a thin, plastic tube containing progesterone hormone. It does not contain estrogen hormone. Your health care provider inserts the tube in the inner part of the upper arm. The tube can remain in place for up to 3 years. After 3 years, the implant must be removed. The implant prevents the ovaries from releasing an egg (ovulation), thickens the cervical mucus to prevent sperm from entering the uterus, and thins the lining of the inside of the uterus.  Progesterone-only injections These injections are given every 3 months by your health care provider to prevent pregnancy. This synthetic progesterone hormone stops the ovaries from releasing eggs. It also thickens cervical mucus and changes the uterine lining. This makes it harder for sperm to survive in the uterus.  Birth control pills These pills contain estrogen and progesterone hormone. They work by preventing the ovaries from releasing eggs (ovulation). They also cause the cervical mucus to thicken, preventing the sperm from entering the uterus. Birth control pills are prescribed by a health care provider.Birth control pills can also be used to treat heavy periods.  Minipill This type of birth control pill contains only the progesterone hormone. They are taken every day of each month and must be prescribed by your health care provider.  Birth control patch The patch contains hormones similar to those in  birth control pills. It must be changed once a week and is prescribed by a health care provider.  Vaginal ring The ring contains hormones similar to those in birth control pills. It is left in the  vagina for 3 weeks, removed for 1 week, and then a new one is put back in place. The patient must be comfortable inserting and removing the ring from the vagina.A health care provider's prescription is necessary.  Emergency contraception Emergency contraceptives prevent pregnancy after unprotected sexual intercourse. This pill can be taken right after sex or up to 5 days after unprotected sex. It is most effective the sooner you take the pills after having sexual intercourse. Most emergency contraceptive pills are available without a prescription. Check with your pharmacist. Do not use emergency contraception as your only form of birth control. BARRIER METHODS   Female condom This is a thin sheath (latex or rubber) that is worn over the penis during sexual intercourse. It can be used with spermicide to increase effectiveness.  Female condom. This is a soft, loose-fitting sheath that is put into the vagina before sexual intercourse.  Diaphragm This is a soft, latex, dome-shaped barrier that must be fitted by a health care provider. It is inserted into the vagina, along with a spermicidal jelly. It is inserted before intercourse. The diaphragm should be left in the vagina for 6 to 8 hours after intercourse.  Cervical cap This is a round, soft, latex or plastic cup that fits over the cervix and must be fitted by a health care provider. The cap can be left in place for up to 48 hours after intercourse.  Sponge This is a soft, circular piece of polyurethane foam. The sponge has spermicide in it. It is inserted into the vagina after wetting it and before sexual intercourse.  Spermicides These are chemicals that kill or block sperm from entering the cervix and uterus. They come in the form of creams, jellies, suppositories, foam, or tablets. They do not require a prescription. They are inserted into the vagina with an applicator before having sexual intercourse. The process must be repeated every time you  have sexual intercourse. INTRAUTERINE CONTRACEPTION  Intrauterine device (IUD) This is a T-shaped device that is put in a woman's uterus during a menstrual period to prevent pregnancy. There are 2 types:  Copper IUD This type of IUD is wrapped in copper wire and is placed inside the uterus. Copper makes the uterus and fallopian tubes produce a fluid that kills sperm. It can stay in place for 10 years.  Hormone IUD This type of IUD contains the hormone progestin (synthetic progesterone). The hormone thickens the cervical mucus and prevents sperm from entering the uterus, and it also thins the uterine lining to prevent implantation of a fertilized egg. The hormone can weaken or kill the sperm that get into the uterus. It can stay in place for 3 5 years, depending on which type of IUD is used. PERMANENT METHODS OF CONTRACEPTION  Female tubal ligation This is when the woman's fallopian tubes are surgically sealed, tied, or blocked to prevent the egg from traveling to the uterus.  Hysteroscopic sterilization This involves placing a small coil or insert into each fallopian tube. Your doctor uses a technique called hysteroscopy to do the procedure. The device causes scar tissue to form. This results in permanent blockage of the fallopian tubes, so the sperm cannot fertilize the egg. It takes about 3 months after the procedure for the tubes to become blocked.  You must use another form of birth control for these 3 months.  Female sterilization This is when the female has the tubes that carry sperm tied off (vasectomy).This blocks sperm from entering the vagina during sexual intercourse. After the procedure, the man can still ejaculate fluid (semen). NATURAL PLANNING METHODS  Natural family planning This is not having sexual intercourse or using a barrier method (condom, diaphragm, cervical cap) on days the woman could become pregnant.  Calendar method This is keeping track of the length of each menstrual  cycle and identifying when you are fertile.  Ovulation method This is avoiding sexual intercourse during ovulation.  Symptothermal method This is avoiding sexual intercourse during ovulation, using a thermometer and ovulation symptoms.  Post ovulation method This is timing sexual intercourse after you have ovulated. Regardless of which type or method of contraception you choose, it is important that you use condoms to protect against the transmission of sexually transmitted infections (STIs). Talk with your health care provider about which form of contraception is most appropriate for you. Document Released: 12/13/2005 Document Revised: 08/15/2013 Document Reviewed: 06/07/2013 [facility] Patient Information 2014 [place].

## 2014-04-08 NOTE — MAU Provider Note (Signed)
History     CSN: [id number]  Arrival date and time: 04/08/14 1040   First Provider Initiated Contact with Patient 04/08/14 1227      Chief Complaint  Patient presents with  . Possible Pregnancy  . Abdominal Pain  . Vaginal Discharge   Possible Pregnancy Associated symptoms include abdominal pain (lower abdominal), headaches (mild intermittent), myalgias (cramping sensation in her lower legs) and nausea. Pertinent negatives include no chest pain, coughing, fever, rash, sore throat or vomiting.  Abdominal Pain Associated symptoms include diarrhea (mild), frequency (8x/day), headaches (mild intermittent), myalgias (cramping sensation in her lower legs) and nausea. Pertinent negatives include no constipation, dysuria, fever, hematuria or vomiting.  Vaginal Discharge The patient's primary symptoms include a vaginal discharge. Associated symptoms include abdominal pain (lower abdominal), diarrhea (mild), frequency (8x/day), headaches (mild intermittent) and nausea. Pertinent negatives include no constipation, dysuria, fever, flank pain, hematuria, joint pain, rash, sore throat, urgency or vomiting.   Tammie Brown is a 33 y.o., [id number] presenting with a 3 day history of pelvic and lower abdominal pain. Patient also reports a +HPT. Her last LMP was 03/25/14 and was irregular (early by 1 week and shorter in duration). The patient also reports a small amount of clear vaginal discharge in the last 2 days but denies vaginal bleeding. Also, she has had increased urinary frequency (8x per day), nausea without vomiting and mild diarrhea. Denies hematuria, blood in stool, constipation and fever.   Past Medical History  Diagnosis Date  . Headache(784.0)   . Heart murmur   . Urinary tract infection   . PID (acute pelvic inflammatory disease)   . Gonorrhea   . Chlamydia     Past Surgical History  Procedure Laterality Date  . Cesarean section    . Appendectomy    . Cesarean section  2001 & 2009     Family History  Problem Relation Age of Onset  . Other Neg Hx   . Hearing loss Neg Hx   . Asthma Mother   . Hypertension Mother   . Diabetes Father   . Cancer Maternal Grandfather     throat    History  Substance Use Topics  . Smoking status: Current Every Day Smoker -- 0.25 packs/day for 15 years    Types: Cigarettes  . Smokeless tobacco: Never Used  . Alcohol Use: Yes     Comment: occassional few times a month    Allergies:  Allergies  Allergen Reactions  . Penicillins Rash  . Tuberculin Tests Itching, Palpitations and Other (See Comments)    Made her mouth feel like she was eating ice    Prescriptions prior to admission  Medication Sig Dispense Refill  . hydrocortisone cream 1 % Apply 1 application topically daily as needed for itching (dry skin).      . ranitidine (ZANTAC) 75 MG tablet Take 75 mg by mouth daily as needed for heartburn.        Review of Systems  Constitutional: Negative for fever.  HENT: Negative for hearing loss and sore throat.   Eyes: Negative for blurred vision.  Respiratory: Negative for cough and shortness of breath.   Cardiovascular: Negative for chest pain and leg swelling.  Gastrointestinal: Positive for nausea, abdominal pain (lower abdominal) and diarrhea (mild). Negative for vomiting, constipation and blood in stool.  Genitourinary: Positive for frequency (8x/day) and vaginal discharge. Negative for dysuria, urgency, hematuria and flank pain.  Musculoskeletal: Positive for myalgias (cramping sensation in her lower legs). Negative for joint pain.  Skin: Negative for rash.  Neurological: Positive for headaches (mild intermittent). Negative for dizziness.   Physical Exam   Blood pressure 111/65, pulse 79, temperature 98.4 F (36.9 C), temperature source Oral, resp. rate 16, height 5' 1.5" (1.562 m), weight 80.65 kg (177 lb 12.8 oz), last menstrual period 03/25/2014, SpO2 99.00%.  Physical Exam  Constitutional: She is oriented to  person, place, and time. She appears well-developed and well-nourished. No distress.  HENT:  Head: Normocephalic and atraumatic.  Eyes: EOM are normal. Pupils are equal, round, and reactive to light. No scleral icterus.  Neck: Normal range of motion. No tracheal deviation present.  Cardiovascular: Normal rate, regular rhythm, normal heart sounds and intact distal pulses.  Exam reveals no gallop and no friction rub.   No murmur heard. Respiratory: Effort normal and breath sounds normal. No stridor. No respiratory distress. She has no wheezes. She has no rales.  GI: Soft. Bowel sounds are normal. She exhibits no distension and no mass. There is tenderness (lower abdomen). There is no rebound and no guarding.  Genitourinary: There is no rash, tenderness or lesion on the right labia. There is no rash, tenderness or lesion on the left labia. Cervix exhibits motion tenderness (mild rightsided CMT) and discharge (copious amount of thick white discharge). Cervix exhibits no friability. Right adnexum displays no mass, no tenderness and no fullness. Left adnexum displays no mass, no tenderness and no fullness. No erythema, tenderness or bleeding around the vagina. No signs of injury around the vagina. Vaginal discharge (white dried discharge) found.  Musculoskeletal: Normal range of motion. She exhibits no edema and no tenderness.  Neurological: She is alert and oriented to person, place, and time.  Skin: Skin is warm and dry. No rash noted. She is not diaphoretic. No erythema.  Psychiatric: She has a normal mood and affect.    MAU Course  Procedures  Results for orders placed during the hospital encounter of 04/08/14 (from the past 24 hour(s))  URINALYSIS, ROUTINE W REFLEX MICROSCOPIC     Status: Abnormal   Collection Time    04/08/14 11:05 AM      Result Value Ref Range   Color, Urine YELLOW  YELLOW   APPearance CLEAR  CLEAR   Specific Gravity, Urine >1.030 (*) 1.005 - 1.030   pH 6.0  5.0 - 8.0    Glucose, UA NEGATIVE  NEGATIVE mg/dL   Hgb urine dipstick TRACE (*) NEGATIVE   Bilirubin Urine NEGATIVE  NEGATIVE   Ketones, ur 15 (*) NEGATIVE mg/dL   Protein, ur NEGATIVE  NEGATIVE mg/dL   Urobilinogen, UA 0.2  0.0 - 1.0 mg/dL   Nitrite NEGATIVE  NEGATIVE   Leukocytes, UA NEGATIVE  NEGATIVE  URINE MICROSCOPIC-ADD ON     Status: Abnormal   Collection Time    04/08/14 11:05 AM      Result Value Ref Range   Squamous Epithelial / LPF FEW (*) RARE   RBC / HPF 0-2  <3 RBC/hpf  HCG, QUANTITATIVE, PREGNANCY     Status: None   Collection Time    04/08/14 12:00 PM      Result Value Ref Range   hCG, Beta Chain, Quant, S <1  <5 mIU/mL  WET PREP, GENITAL     Status: Abnormal   Collection Time    04/08/14  1:15 PM      Result Value Ref Range   Yeast Wet Prep HPF POC NONE SEEN  NONE SEEN   Trich, Wet Prep NONE SEEN  NONE SEEN   Clue Cells Wet Prep HPF POC NONE SEEN  NONE SEEN   WBC, Wet Prep HPF POC FEW (*) NONE SEEN     Assessment and Plan   Tammie Brown is a 33 y.o., [id number] presenting for pelvic/lower abdominal pain and +HPT. Possible pregnancy, STI or UTI.  Plan (12:35): UA, beta HCG, GC Chlamydia probe, wet mount   Update (13:15):  - beta HCG < 1 - PE findings concerning for STI or BV - Discussed oral contraception - Toradol injection, Plan B in MAU - Plan B today - Start Orthocyclen BCP one po qday # 4 refills -make an appointment for family planning at [facility] 800 mg TID -Phenergan 25 mg PO q6 hours prn nausea   Tammie Brown 04/08/2014, 1:55 PM

## 2014-04-08 NOTE — MAU Note (Signed)
Patient states she had a positive home pregnancy test 2 days ago. Has been having lower abdominal pain and pressure. Denies bleeding but has a slight vaginal discharge. Has some nausea, no vomiting.

## 2014-04-08 NOTE — MAU Provider Note (Signed)
Attestation of Attending Supervision of Advanced Practitioner (CNM/NP): Evaluation and management procedures were performed by the Advanced Practitioner under my supervision and collaboration.  I have reviewed the Advanced Practitioner's note and chart, and I agree with the management and plan.  Alease Fait Harraway-Smith 3:17 PM

## 2014-04-09 LAB — GC/CHLAMYDIA PROBE AMP
CT Probe RNA: NEGATIVE
GC PROBE AMP APTIMA: NEGATIVE

## 2014-05-09 ENCOUNTER — Emergency Department (HOSPITAL_COMMUNITY)
Admission: EM | Admit: 2014-05-09 | Discharge: 2014-05-09 | Disposition: A | Discharge status: Home / Self Care | Payer: Medicaid Other | Attending: Emergency Medicine | Admitting: Emergency Medicine

## 2014-05-09 ENCOUNTER — Encounter (HOSPITAL_COMMUNITY): Payer: Self-pay | Admitting: Emergency Medicine

## 2014-05-09 ENCOUNTER — Emergency Department (HOSPITAL_COMMUNITY): Payer: Medicaid Other

## 2014-05-09 DIAGNOSIS — R011 Cardiac murmur, unspecified: Secondary | ICD-10-CM | POA: Insufficient documentation

## 2014-05-09 DIAGNOSIS — Z8619 Personal history of other infectious and parasitic diseases: Secondary | ICD-10-CM | POA: Insufficient documentation

## 2014-05-09 DIAGNOSIS — F17200 Nicotine dependence, unspecified, uncomplicated: Secondary | ICD-10-CM | POA: Insufficient documentation

## 2014-05-09 DIAGNOSIS — Z880 Allergy status to penicillin: Secondary | ICD-10-CM | POA: Insufficient documentation

## 2014-05-09 DIAGNOSIS — J329 Chronic sinusitis, unspecified: Secondary | ICD-10-CM | POA: Insufficient documentation

## 2014-05-09 DIAGNOSIS — Z87440 Personal history of urinary (tract) infections: Secondary | ICD-10-CM | POA: Insufficient documentation

## 2014-05-09 DIAGNOSIS — Z8742 Personal history of other diseases of the female genital tract: Secondary | ICD-10-CM | POA: Insufficient documentation

## 2014-05-09 MED ORDER — LORATADINE-PSEUDOEPHEDRINE ER 5-120 MG PO TB12: 1.0000 | ORAL_TABLET | Freq: Two times a day (BID) | ORAL | Status: DC

## 2014-05-09 MED ORDER — AZITHROMYCIN 250 MG PO TABS: 250.0000 mg | ORAL_TABLET | Freq: Every day | ORAL | Status: DC

## 2014-05-09 NOTE — Discharge Instructions (Signed)
Take Azithromycin as directed until gone. Take Claritin-D daily for allergies. Refer to attached documents for more information.

## 2014-05-09 NOTE — ED Provider Notes (Signed)
Medical screening examination/treatment/procedure(s) were performed by non-physician practitioner and as supervising physician I was immediately available for consultation/collaboration.   EKG Interpretation None        Blanchard Kelch, MD 05/09/14 2058

## 2014-05-09 NOTE — ED Provider Notes (Signed)
CSN: [id number]     Arrival date & time 05/09/14  0826 History  This chart was scribed for non-physician practitioner, Alvina Chou, PA-C working with Blanchard Kelch, MD by Frederich Balding, ED scribe. This patient was seen in room TR06C/TR06C and the patient's care was started at 9:28 AM.   Chief Complaint  Patient presents with  . URI   The history is provided by the patient. No language interpreter was used.   HPI Comments: Tammie Brown is a 33 y.o. female who presents to the Emergency Department complaining of productive cough of green sputum that started yesterday. Pt is also having sore throat, chills and headache. She has taken ibuprofen with little relief. Denies fever. Pt's nephew has been sick with similar symptoms.   Past Medical History  Diagnosis Date  . Headache(784.0)   . Heart murmur   . Urinary tract infection   . PID (acute pelvic inflammatory disease)   . Gonorrhea   . Chlamydia    Past Surgical History  Procedure Laterality Date  . Cesarean section    . Appendectomy    . Cesarean section  2001 & 2009   Family History  Problem Relation Age of Onset  . Other Neg Hx   . Hearing loss Neg Hx   . Asthma Mother   . Hypertension Mother   . Diabetes Father   . Cancer Maternal Grandfather     throat   History  Substance Use Topics  . Smoking status: Current Every Day Smoker -- 0.25 packs/day for 15 years    Types: Cigarettes  . Smokeless tobacco: Never Used  . Alcohol Use: Yes     Comment: occassional few times a month   OB History   Grav Para Term Preterm Abortions TAB SAB Ect Mult Living   2 2 2       2      Review of Systems  Constitutional: Negative for fever.  HENT: Positive for sore throat.   Respiratory: Positive for cough.   Neurological: Positive for headaches.  All other systems reviewed and are negative.  Allergies  Penicillins and Tuberculin tests  Home Medications   Prior to Admission medications   Medication Sig Start Date  End Date Taking? Authorizing Provider  hydrocortisone cream 1 % Apply 1 application topically daily as needed for itching (dry skin).   Yes Historical Provider, MD  ibuprofen (ADVIL,MOTRIN) 800 MG tablet Take 800 mg by mouth every 8 (eight) hours as needed for mild pain.   Yes Historical Provider, MD  promethazine (PHENERGAN) 25 MG tablet Take 25 mg by mouth every 6 (six) hours as needed for nausea or vomiting.   Yes Historical Provider, MD   BP 104/57  Pulse 73  Temp(Src) 98.9 F (37.2 C)  Resp 16  SpO2 100%  LMP 04/23/2014  Physical Exam  Nursing note and vitals reviewed. Constitutional: She is oriented to person, place, and time. She appears well-developed and well-nourished. No distress.  HENT:  Head: Normocephalic and atraumatic.  Nose: Right sinus exhibits maxillary sinus tenderness. Right sinus exhibits no frontal sinus tenderness. Left sinus exhibits maxillary sinus tenderness. Left sinus exhibits no frontal sinus tenderness.  Mouth/Throat: Posterior oropharyngeal erythema (mild) present. No oropharyngeal exudate.  Eyes: EOM are normal.  Neck: Neck supple. No tracheal deviation present.  Cardiovascular: Normal rate, regular rhythm and normal heart sounds.   Pulmonary/Chest: Effort normal and breath sounds normal. No respiratory distress. She has no wheezes. She has no rhonchi. She has no  rales.  Musculoskeletal: Normal range of motion.  Neurological: She is alert and oriented to person, place, and time.  Skin: Skin is warm and dry.  Psychiatric: She has a normal mood and affect. Her behavior is normal.    ED Course  Procedures (including critical care time)  DIAGNOSTIC STUDIES: Oxygen Saturation is 100% on RA, normal by my interpretation.    COORDINATION OF CARE: 9:30 AM-Discussed treatment plan which includes an antibiotic with pt at bedside and pt agreed to plan.   Labs Review Labs Reviewed - No data to display  Imaging Review Dg Chest 2 View  05/09/2014    CLINICAL DATA:  Cough and congestion for 4 days with history of tobacco use  EXAM: CHEST  2 VIEW  COMPARISON:  DG CHEST 2 VIEW dated 11/28/2013  FINDINGS: The lungs are well-expanded and clear. The cardiopericardial silhouette is normal in size. The pulmonary vascularity is not engorged. The mediastinum is normal in width. There is no pleural effusion. The observed portions of the bony thorax appear normal.  IMPRESSION: There is no evidence of pneumonia nor other active cardiopulmonary disease.   Electronically Signed   By: David  Martinique   On: 05/09/2014 09:21     EKG Interpretation None      MDM   Final diagnoses:  Sinusitis    Patient likely has sinusitis in conjunction with seasonal allergies. Patient will have Azithromycin and Claritin-D for symptoms. Vitals stable and patient afebrile.   I personally performed the services described in this documentation, which was scribed in my presence. The recorded information has been reviewed and is accurate.  Alvina Chou, [place] 05/09/14 [phone]

## 2014-05-09 NOTE — ED Notes (Signed)
Szekalski, PA at bedside for evaluation.

## 2014-05-09 NOTE — ED Notes (Signed)
sorethroat since Monday and chills Tuesday and cough and h/a started yesterday

## 2014-08-10 ENCOUNTER — Encounter (HOSPITAL_COMMUNITY): Payer: Self-pay | Admitting: Emergency Medicine

## 2014-08-10 ENCOUNTER — Emergency Department (HOSPITAL_COMMUNITY)
Admission: EM | Admit: 2014-08-10 | Discharge: 2014-08-10 | Disposition: A | Discharge status: Home / Self Care | Payer: Medicaid Other | Attending: Emergency Medicine | Admitting: Emergency Medicine

## 2014-08-10 DIAGNOSIS — Z3202 Encounter for pregnancy test, result negative: Secondary | ICD-10-CM | POA: Insufficient documentation

## 2014-08-10 DIAGNOSIS — Z880 Allergy status to penicillin: Secondary | ICD-10-CM | POA: Insufficient documentation

## 2014-08-10 DIAGNOSIS — Z8619 Personal history of other infectious and parasitic diseases: Secondary | ICD-10-CM | POA: Diagnosis not present

## 2014-08-10 DIAGNOSIS — R011 Cardiac murmur, unspecified: Secondary | ICD-10-CM | POA: Insufficient documentation

## 2014-08-10 DIAGNOSIS — M5380 Other specified dorsopathies, site unspecified: Secondary | ICD-10-CM | POA: Insufficient documentation

## 2014-08-10 DIAGNOSIS — M62830 Muscle spasm of back: Secondary | ICD-10-CM

## 2014-08-10 DIAGNOSIS — Z87440 Personal history of urinary (tract) infections: Secondary | ICD-10-CM | POA: Diagnosis not present

## 2014-08-10 DIAGNOSIS — M545 Low back pain, unspecified: Secondary | ICD-10-CM | POA: Diagnosis present

## 2014-08-10 DIAGNOSIS — F17200 Nicotine dependence, unspecified, uncomplicated: Secondary | ICD-10-CM | POA: Insufficient documentation

## 2014-08-10 DIAGNOSIS — Z87448 Personal history of other diseases of urinary system: Secondary | ICD-10-CM | POA: Diagnosis not present

## 2014-08-10 LAB — URINALYSIS, ROUTINE W REFLEX MICROSCOPIC
Bilirubin Urine: NEGATIVE
GLUCOSE, UA: NEGATIVE mg/dL
HGB URINE DIPSTICK: NEGATIVE
Ketones, ur: NEGATIVE mg/dL
LEUKOCYTES UA: NEGATIVE
Nitrite: NEGATIVE
Protein, ur: NEGATIVE mg/dL
Specific Gravity, Urine: 1.031 — ABNORMAL HIGH (ref 1.005–1.030)
Urobilinogen, UA: 1 mg/dL (ref 0.0–1.0)
pH: 6 (ref 5.0–8.0)

## 2014-08-10 LAB — POC URINE PREG, ED: Preg Test, Ur: NEGATIVE

## 2014-08-10 MED ORDER — DIAZEPAM 5 MG PO TABS
5.0000 mg | ORAL_TABLET | Freq: Once | ORAL | Status: AC
  Administered 2014-08-10: 5 mg via ORAL
  Filled 2014-08-10: qty 1

## 2014-08-10 MED ORDER — DIAZEPAM 5 MG PO TABS: 5.0000 mg | ORAL_TABLET | Freq: Four times a day (QID) | ORAL | Status: DC | PRN

## 2014-08-10 MED ORDER — MELOXICAM 15 MG PO TABS: 15.0000 mg | ORAL_TABLET | Freq: Every day | ORAL | Status: DC

## 2014-08-10 NOTE — ED Notes (Signed)
Pt presents with c/o mid to lower back pain that started Tuesday of this week. Pt reports no injuries to that area. Pt denies any trouble with urination or blood in her urine.

## 2014-08-10 NOTE — ED Provider Notes (Signed)
CSN: [id number]     Arrival date & time 08/10/14  1954 History  This chart was scribed for non-physician practitioner, Hazel Sams, PA-C,working with Orlie Dakin, MD, by Marlowe Kays, ED Scribe. This patient was seen in room WTR6/WTR6 and the patient's care was started at 9:34 PM.  Chief Complaint  Patient presents with  . Back Pain   Patient is a 33 y.o. female presenting with back pain. The history is provided by the patient. No language interpreter was used.  Back Pain  HPI Comments:  Tammie Brown is a 33 y.o. female who presents to the Emergency Department complaining of severe mid to low back pain onset four days ago. Pt states the pain is located more on the left side. She states the pain was sudden while walking and reached to turn off the light. She states she has had a sinus infection and was taking Tylenol Cold and Sinus. She states she has taken regular OTC Tylenol with some relief. She reports lying on her sides make the pain worse. Deep breathing or coughing also exacerbates the pain. She denies trauma, injury or fall. She denies bowel or bladder incontinence, numbness or tingling of the lower extremities, dysuria, hematuria or rash. She denies any recent travel. She is not on any contraceptives. She denies h/o DVT or PE.  Past Medical History  Diagnosis Date  . Headache(784.0)   . Heart murmur   . Urinary tract infection   . PID (acute pelvic inflammatory disease)   . Gonorrhea   . Chlamydia    Past Surgical History  Procedure Laterality Date  . Cesarean section    . Appendectomy    . Cesarean section  2001 & 2009   Family History  Problem Relation Age of Onset  . Other Neg Hx   . Hearing loss Neg Hx   . Asthma Mother   . Hypertension Mother   . Diabetes Father   . Cancer Maternal Grandfather     throat   History  Substance Use Topics  . Smoking status: Current Every Day Smoker -- 0.25 packs/day for 15 years    Types: Cigarettes  . Smokeless tobacco:  Never Used  . Alcohol Use: Yes     Comment: occassional few times a month   OB History   Grav Para Term Preterm Abortions TAB SAB Ect Mult Living   2 2 2       2      Review of Systems  Musculoskeletal: Positive for back pain.  All other systems reviewed and are negative.   Allergies  Penicillins and Tuberculin tests  Home Medications   Prior to Admission medications   Not on File   Triage Vitals: BP 126/65  Pulse 72  Temp(Src) 97.7 F (36.5 C) (Oral)  Resp 18  SpO2 98%  LMP 08/05/2014 Physical Exam  Nursing note and vitals reviewed. Constitutional: She is oriented to person, place, and time. She appears well-developed and well-nourished.  HENT:  Head: Normocephalic and atraumatic.  Eyes: EOM are normal.  Neck: Normal range of motion.  Cardiovascular: Normal rate.   Pulmonary/Chest: Effort normal.  Abdominal: Soft. There is no tenderness.  Musculoskeletal: Normal range of motion.       Lumbar back: She exhibits tenderness. She exhibits no edema and no deformity.       Back:  Neurological: She is alert and oriented to person, place, and time.  Skin: Skin is warm and dry.  Psychiatric: She has a normal mood and  affect. Her behavior is normal.    ED Course  Procedures  DIAGNOSTIC STUDIES: Oxygen Saturation is 98% on RA, normal by my interpretation.   COORDINATION OF CARE: 9:39 PM- Will order urinalysis. Pt verbalizes understanding and agrees to plan.   labs unremarkable. History and exam consistent with likely muscle spasms.  Will continue with Valium for continued muscle spasm.   Medications  diazepam (VALIUM) tablet 5 mg (not administered)    Results for orders placed during the hospital encounter of 08/10/14  URINALYSIS, ROUTINE W REFLEX MICROSCOPIC      Result Value Ref Range   Color, Urine YELLOW  YELLOW   APPearance CLEAR  CLEAR   Specific Gravity, Urine 1.031 (*) 1.005 - 1.030   pH 6.0  5.0 - 8.0   Glucose, UA NEGATIVE  NEGATIVE mg/dL   Hgb  urine dipstick NEGATIVE  NEGATIVE   Bilirubin Urine NEGATIVE  NEGATIVE   Ketones, ur NEGATIVE  NEGATIVE mg/dL   Protein, ur NEGATIVE  NEGATIVE mg/dL   Urobilinogen, UA 1.0  0.0 - 1.0 mg/dL   Nitrite NEGATIVE  NEGATIVE   Leukocytes, UA NEGATIVE  NEGATIVE  POC URINE PREG, ED      Result Value Ref Range   Preg Test, Ur NEGATIVE  NEGATIVE     MDM   Final diagnoses:  Muscle spasm of back      I personally performed the services described in this documentation, which was scribed in my presence. The recorded information has been reviewed and is accurate.    Martie Lee, PA-C 08/10/14 [phone]

## 2014-08-10 NOTE — Discharge Instructions (Signed)
Your providers feel that your low back pain is caused from muscle spasms. Please use Valium to treat muscle spasms and Mobic for pain and inflammation. Followup with a primary care provider for continued evaluation and treatment.    Muscle Cramps and Spasms Muscle cramps and spasms are when muscles tighten by themselves. They usually get better within minutes. Muscle cramps are painful. They are usually stronger and last longer than muscle spasms. Muscle spasms may or may not be painful. They can last a few seconds or much longer. HOME CARE  Drink enough fluid to keep your pee (urine) clear or pale yellow.  Massage, stretch, and relax the muscle.  Use a warm towel, heating pad, or warm shower water on tight muscles.  Place ice on the muscle if it is tender or in pain.  Put ice in a plastic bag.  Place a towel between your skin and the bag.  Leave the ice on for 15-20 minutes, 03-04 times a day.  Only take medicine as told by your doctor. GET HELP RIGHT AWAY IF:  Your cramps or spasms get worse, happen more often, or do not get better with time. MAKE SURE YOU:  Understand these instructions.  Will watch your condition.  Will get help right away if you are not doing well or get worse. Document Released: 11/25/2008 Document Revised: 04/09/2013 Document Reviewed: 11/29/2012 [facility] Patient Information 2015 [place], [place]. This information is not intended to replace advice given to you by your health care provider. Make sure you discuss any questions you have with your health care provider.

## 2014-08-11 NOTE — ED Provider Notes (Signed)
Medical screening examination/treatment/procedure(s) were performed by non-physician practitioner and as supervising physician I was immediately available for consultation/collaboration.   EKG Interpretation None       Orlie Dakin, MD 08/11/14 [phone]

## 2014-10-15 ENCOUNTER — Inpatient Hospital Stay (HOSPITAL_COMMUNITY)
Admission: AD | Admit: 2014-10-15 | Discharge: 2014-10-16 | Disposition: A | Discharge status: Home / Self Care | Payer: Medicaid Other | Source: Ambulatory Visit | Attending: Obstetrics & Gynecology | Admitting: Obstetrics & Gynecology

## 2014-10-15 DIAGNOSIS — N946 Dysmenorrhea, unspecified: Secondary | ICD-10-CM | POA: Insufficient documentation

## 2014-10-15 DIAGNOSIS — R1905 Periumbilic swelling, mass or lump: Secondary | ICD-10-CM | POA: Insufficient documentation
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
## 2014-10-15 NOTE — MAU Note (Signed)
CANNOT GIVE URINE   SPECIMEN

## 2014-10-16 ENCOUNTER — Encounter (HOSPITAL_COMMUNITY): Payer: Self-pay | Admitting: *Deleted

## 2014-10-16 DIAGNOSIS — N946 Dysmenorrhea, unspecified: Secondary | ICD-10-CM

## 2014-10-16 LAB — URINALYSIS, ROUTINE W REFLEX MICROSCOPIC
Bilirubin Urine: NEGATIVE
Glucose, UA: NEGATIVE mg/dL
Hgb urine dipstick: NEGATIVE
Ketones, ur: NEGATIVE mg/dL
Leukocytes, UA: NEGATIVE
NITRITE: NEGATIVE
PH: 8.5 — AB (ref 5.0–8.0)
Protein, ur: NEGATIVE mg/dL
SPECIFIC GRAVITY, URINE: 1.015 (ref 1.005–1.030)
Urobilinogen, UA: 0.2 mg/dL (ref 0.0–1.0)

## 2014-10-16 LAB — CBC
HEMATOCRIT: 30.8 % — AB (ref 36.0–46.0)
HEMOGLOBIN: 9.7 g/dL — AB (ref 12.0–15.0)
MCH: 23.4 pg — ABNORMAL LOW (ref 26.0–34.0)
MCHC: 31.5 g/dL (ref 30.0–36.0)
MCV: 74.4 fL — AB (ref 78.0–100.0)
PLATELETS: 199 10*3/uL (ref 150–400)
RBC: 4.14 MIL/uL (ref 3.87–5.11)
RDW: 18.1 % — ABNORMAL HIGH (ref 11.5–15.5)
WBC: 5.8 10*3/uL (ref 4.0–10.5)

## 2014-10-16 LAB — WET PREP, GENITAL
Clue Cells Wet Prep HPF POC: NONE SEEN
TRICH WET PREP: NONE SEEN
Yeast Wet Prep HPF POC: NONE SEEN

## 2014-10-16 LAB — RPR

## 2014-10-16 LAB — HIV ANTIBODY (ROUTINE TESTING W REFLEX): HIV 1&2 Ab, 4th Generation: NONREACTIVE

## 2014-10-16 LAB — POCT PREGNANCY, URINE: PREG TEST UR: NEGATIVE

## 2014-10-16 MED ORDER — KETOROLAC TROMETHAMINE 10 MG PO TABS: 10.0000 mg | ORAL_TABLET | Freq: Four times a day (QID) | ORAL | Status: DC | PRN

## 2014-10-16 MED ORDER — KETOROLAC TROMETHAMINE 60 MG/2ML IM SOLN
60.0000 mg | Freq: Once | INTRAMUSCULAR | Status: AC
  Administered 2014-10-16: 60 mg via INTRAMUSCULAR
  Filled 2014-10-16: qty 2

## 2014-10-16 NOTE — Discharge Instructions (Signed)

## 2014-10-16 NOTE — MAU Provider Note (Signed)
History     CSN: [id number]  Arrival date and time: 10/15/14 2057   First Provider Initiated Contact with Patient 10/16/14 0112      No chief complaint on file.  HPI Tammie Brown 33 y.o. [id number] nonpregnant female presents to MAU complaining of abdominal pain and back pain that began 10/19.  She notes no unusual activity leading up to this.  It is located in the center of the abdomen.  She has a knot located there and this is associated with the pain.  It has been present for years and the pain comes and goes.  The pain is worst with menses.  She has noticed vaginal spotting which has turned into bleeding today.  Prior LMP was 09/22/14.  She denies vaginal discharge, dysuria, vomiting, diarrhea, constipation, weakness, fever.  She has had some nausea.  She requests STD testing.   OB History   Grav Para Term Preterm Abortions TAB SAB Ect Mult Living   2 2 2       2       Past Medical History  Diagnosis Date  . Headache(784.0)   . Heart murmur   . Urinary tract infection   . PID (acute pelvic inflammatory disease)   . Gonorrhea   . Chlamydia     Past Surgical History  Procedure Laterality Date  . Cesarean section    . Appendectomy    . Cesarean section  2001 & 2009    Family History  Problem Relation Age of Onset  . Other Neg Hx   . Hearing loss Neg Hx   . Asthma Mother   . Hypertension Mother   . Diabetes Father   . Cancer Maternal Grandfather     throat    History  Substance Use Topics  . Smoking status: Current Every Day Smoker -- 0.25 packs/day for 15 years    Types: Cigarettes  . Smokeless tobacco: Never Used  . Alcohol Use: Yes     Comment: occassional few times a month  LAST DRANK- FRIDAY-  BEER AND LIQUOR    Allergies:  Allergies  Allergen Reactions  . Penicillins Rash  . Tuberculin Tests Itching, Palpitations and Other (See Comments)    Made her mouth feel like she was eating ice    Prescriptions prior to admission  Medication Sig Dispense  Refill  . meloxicam (MOBIC) 15 MG tablet Take 1 tablet (15 mg total) by mouth daily.  30 tablet  0    ROSPertinent ROS in HPI Physical Exam   Blood pressure 120/66, pulse 73, temperature 98.5 F (36.9 C), temperature source Oral, resp. rate 20, height 5\' 2"  (1.575 m), weight 175 lb 8 oz (79.606 kg), last menstrual period 09/22/2014.  Physical Exam  Constitutional: She is oriented to person, place, and time. She appears well-developed and well-nourished. No distress.  HENT:  Head: Normocephalic and atraumatic.  Eyes: EOM are normal.  Neck: Normal range of motion.  Cardiovascular: Normal rate and regular rhythm.   Respiratory: Effort normal and breath sounds normal. No respiratory distress.  GI: Soft. Bowel sounds are normal. She exhibits no distension. There is no tenderness. There is no rebound.  With increased pressure, there is small bulge at umbilicus.  With relaxation, this is resolved.  Pt notes no tenderness with this.  Genitourinary:  Large amt of dark red blood and small clots in vault.  No CMT/adnexal tenderness or mass  Musculoskeletal: Normal range of motion.  Neurological: She is alert and oriented to person,  place, and time.  Skin: Skin is warm and dry.  Psychiatric: She has a normal mood and affect.   Results for orders placed during the hospital encounter of 10/15/14 (from the past 24 hour(s))  URINALYSIS, ROUTINE W REFLEX MICROSCOPIC     Status: Abnormal   Collection Time    10/15/14  9:35 PM      Result Value Ref Range   Color, Urine YELLOW  YELLOW   APPearance CLEAR  CLEAR   Specific Gravity, Urine 1.015  1.005 - 1.030   pH 8.5 (*) 5.0 - 8.0   Glucose, UA NEGATIVE  NEGATIVE mg/dL   Hgb urine dipstick NEGATIVE  NEGATIVE   Bilirubin Urine NEGATIVE  NEGATIVE   Ketones, ur NEGATIVE  NEGATIVE mg/dL   Protein, ur NEGATIVE  NEGATIVE mg/dL   Urobilinogen, UA 0.2  0.0 - 1.0 mg/dL   Nitrite NEGATIVE  NEGATIVE   Leukocytes, UA NEGATIVE  NEGATIVE  POCT PREGNANCY,  URINE     Status: None   Collection Time    10/16/14 12:20 AM      Result Value Ref Range   Preg Test, Ur NEGATIVE  NEGATIVE  WET PREP, GENITAL     Status: Abnormal   Collection Time    10/16/14  2:25 AM      Result Value Ref Range   Yeast Wet Prep HPF POC NONE SEEN  NONE SEEN   Trich, Wet Prep NONE SEEN  NONE SEEN   Clue Cells Wet Prep HPF POC NONE SEEN  NONE SEEN   WBC, Wet Prep HPF POC FEW (*) NONE SEEN     MAU Course  Procedures  MDM Toradol IM given for pain/discomfort.  Pt noted this gave good relief.  Pt noted slight increase in pain following pelvic exam.   She asks to go home and states will find GYN  Assessment and Plan  A:  1. Menstrual cramps   ? Umbilical hernia  P: Discharge to home STD testing pending Toradol PO for pain management  See GYN asap for full eval See PCP for eval of ?hernia Return to MAU for emergency  Plan discussed with pt who states understanding and agreement.    Paticia Stack 10/16/2014, 1:13 AM

## 2014-10-16 NOTE — MAU Provider Note (Signed)
Attestation of Attending Supervision of Advanced Practitioner (PA/CNM/NP): Evaluation and management procedures were performed by the Advanced Practitioner under my supervision and collaboration.  I have reviewed the Advanced Practitioner's note and chart, and I agree with the management and plan.  Modesty Rudy, MD, FACOG Attending Obstetrician & Gynecologist Faculty Practice, [facility]   

## 2014-10-17 LAB — GC/CHLAMYDIA PROBE AMP
CT PROBE, AMP APTIMA: NEGATIVE
GC Probe RNA: NEGATIVE

## 2014-10-28 ENCOUNTER — Encounter (HOSPITAL_COMMUNITY): Payer: Self-pay | Admitting: *Deleted

## 2015-01-09 ENCOUNTER — Emergency Department (HOSPITAL_COMMUNITY)
Admission: EM | Admit: 2015-01-09 | Discharge: 2015-01-09 | Disposition: A | Discharge status: Home / Self Care | Payer: Medicaid Other | Attending: Emergency Medicine | Admitting: Emergency Medicine

## 2015-01-09 ENCOUNTER — Encounter (HOSPITAL_COMMUNITY): Payer: Self-pay | Admitting: Emergency Medicine

## 2015-01-09 DIAGNOSIS — J029 Acute pharyngitis, unspecified: Secondary | ICD-10-CM | POA: Diagnosis present

## 2015-01-09 DIAGNOSIS — Z792 Long term (current) use of antibiotics: Secondary | ICD-10-CM | POA: Diagnosis not present

## 2015-01-09 DIAGNOSIS — R011 Cardiac murmur, unspecified: Secondary | ICD-10-CM | POA: Diagnosis not present

## 2015-01-09 DIAGNOSIS — Z8742 Personal history of other diseases of the female genital tract: Secondary | ICD-10-CM | POA: Diagnosis not present

## 2015-01-09 DIAGNOSIS — Z720 Tobacco use: Secondary | ICD-10-CM | POA: Insufficient documentation

## 2015-01-09 DIAGNOSIS — J36 Peritonsillar abscess: Secondary | ICD-10-CM

## 2015-01-09 DIAGNOSIS — Z87440 Personal history of urinary (tract) infections: Secondary | ICD-10-CM | POA: Diagnosis not present

## 2015-01-09 DIAGNOSIS — Z8619 Personal history of other infectious and parasitic diseases: Secondary | ICD-10-CM | POA: Diagnosis not present

## 2015-01-09 DIAGNOSIS — Z7952 Long term (current) use of systemic steroids: Secondary | ICD-10-CM | POA: Insufficient documentation

## 2015-01-09 LAB — RAPID STREP SCREEN (MED CTR MEBANE ONLY): Streptococcus, Group A Screen (Direct): NEGATIVE

## 2015-01-09 MED ORDER — KETOROLAC TROMETHAMINE 30 MG/ML IJ SOLN
30.0000 mg | Freq: Once | INTRAMUSCULAR | Status: AC
  Administered 2015-01-09: 30 mg via INTRAVENOUS
  Filled 2015-01-09: qty 1

## 2015-01-09 MED ORDER — CLINDAMYCIN HCL 300 MG PO CAPS: 300.0000 mg | ORAL_CAPSULE | Freq: Three times a day (TID) | ORAL | Status: DC

## 2015-01-09 MED ORDER — ONDANSETRON 4 MG PO TBDP: 4.0000 mg | ORAL_TABLET | Freq: Three times a day (TID) | ORAL | Status: DC | PRN

## 2015-01-09 MED ORDER — CLINDAMYCIN PHOSPHATE 900 MG/50ML IV SOLN
900.0000 mg | Freq: Once | INTRAVENOUS | Status: AC
  Administered 2015-01-09: 900 mg via INTRAVENOUS
  Filled 2015-01-09: qty 50

## 2015-01-09 MED ORDER — PREDNISONE 20 MG PO TABS: 20.0000 mg | ORAL_TABLET | Freq: Every day | ORAL | Status: DC

## 2015-01-09 MED ORDER — MORPHINE SULFATE 4 MG/ML IJ SOLN
4.0000 mg | INTRAMUSCULAR | Status: DC | PRN
  Administered 2015-01-09: 4 mg via INTRAVENOUS
  Filled 2015-01-09: qty 1

## 2015-01-09 MED ORDER — HYDROCODONE-ACETAMINOPHEN 5-325 MG PO TABS: 2.0000 | ORAL_TABLET | ORAL | Status: DC | PRN

## 2015-01-09 MED ORDER — DEXAMETHASONE SODIUM PHOSPHATE 10 MG/ML IJ SOLN
10.0000 mg | Freq: Once | INTRAMUSCULAR | Status: AC
  Administered 2015-01-09: 10 mg via INTRAVENOUS
  Filled 2015-01-09: qty 1

## 2015-01-09 MED ORDER — ONDANSETRON HCL 4 MG/2ML IJ SOLN
4.0000 mg | Freq: Once | INTRAMUSCULAR | Status: AC
  Administered 2015-01-09: 4 mg via INTRAVENOUS
  Filled 2015-01-09: qty 2

## 2015-01-09 NOTE — ED Notes (Signed)
Sore throat x 2 days; chills last night; unable to swallow this morning; right earache

## 2015-01-09 NOTE — ED Provider Notes (Signed)
CSN: [id number]     Arrival date & time 01/09/15  0908 History   First MD Initiated Contact with Patient 01/09/15 0957     Chief Complaint  Patient presents with  . Sore Throat     HPI  Impression presents for evaluation of sore throat. Symptoms for 2-3 days. Worse the point that she is having trouble chewing and swallowing since last night. Chills at home last night. States it is too painful to swallow. Also pain referring to her right ear. No myalgias. Some anterior neck pain. No posterior neck pain or stiffness. No rash. No cough. No nausea vomiting diarrhea.  Past Medical History  Diagnosis Date  . Headache(784.0)   . Heart murmur   . Urinary tract infection   . PID (acute pelvic inflammatory disease)   . Gonorrhea   . Chlamydia    Past Surgical History  Procedure Laterality Date  . Cesarean section    . Appendectomy    . Cesarean section  2001 & 2009   Family History  Problem Relation Age of Onset  . Other Neg Hx   . Hearing loss Neg Hx   . Asthma Mother   . Hypertension Mother   . Diabetes Father   . Cancer Maternal Grandfather     throat   History  Substance Use Topics  . Smoking status: Current Every Day Smoker -- 0.25 packs/day for 15 years    Types: Cigarettes  . Smokeless tobacco: Never Used  . Alcohol Use: Yes     Comment: occassional few times a month  LAST DRANK- FRIDAY-  BEER AND LIQUOR   OB History    Gravida Para Term Preterm AB TAB SAB Ectopic Multiple Living   2 2 2       2      Review of Systems  Constitutional: Negative for fever, chills, diaphoresis, appetite change and fatigue.  HENT: Positive for sore throat, trouble swallowing and voice change. Negative for mouth sores.   Eyes: Negative for visual disturbance.  Respiratory: Negative for cough, chest tightness, shortness of breath and wheezing.   Cardiovascular: Negative for chest pain.  Gastrointestinal: Negative for nausea, vomiting, abdominal pain, diarrhea and abdominal distention.    Endocrine: Negative for polydipsia, polyphagia and polyuria.  Genitourinary: Negative for dysuria, frequency and hematuria.  Musculoskeletal: Negative for gait problem.  Skin: Negative for color change, pallor and rash.  Neurological: Negative for dizziness, syncope, light-headedness and headaches.  Hematological: Does not bruise/bleed easily.  Psychiatric/Behavioral: Negative for behavioral problems and confusion.      Allergies  Penicillins and Tuberculin tests  Home Medications   Prior to Admission medications   Medication Sig Start Date End Date Taking? Authorizing Provider  clindamycin (CLEOCIN) 300 MG capsule Take 1 capsule (300 mg total) by mouth 3 (three) times daily. 01/09/15   Tanna Furry, MD  HYDROcodone-acetaminophen (NORCO/VICODIN) 5-325 MG per tablet Take 2 tablets by mouth every 4 (four) hours as needed. 01/09/15   Tanna Furry, MD  ketorolac (TORADOL) 10 MG tablet Take 1 tablet (10 mg total) by mouth every 6 (six) hours as needed. Patient not taking: Reported on 01/09/2015 10/16/14   Collene Leyden Teague Clark, PA-C  ondansetron (ZOFRAN ODT) 4 MG disintegrating tablet Take 1 tablet (4 mg total) by mouth every 8 (eight) hours as needed for nausea. 01/09/15   Tanna Furry, MD  predniSONE (DELTASONE) 20 MG tablet Take 1 tablet (20 mg total) by mouth daily with breakfast. 01/09/15   Tanna Furry, MD  BP 110/58 mmHg  Pulse 79  Temp(Src) 98 F (36.7 C) (Oral)  Resp 16  SpO2 99% Physical Exam  Constitutional: She is oriented to person, place, and time. She appears well-developed and well-nourished. No distress.  HENT:  Head: Normocephalic.  Mouth/Throat:    Minimal trismus.  Eyes: Conjunctivae are normal. Pupils are equal, round, and reactive to light. No scleral icterus.  Neck: Normal range of motion. Neck supple. No thyromegaly present.  Neck is supple. No induration. Bilateral anterior cervical adenopathy. No posterior cervical nodes.   Cardiovascular: Normal rate and regular  rhythm.  Exam reveals no gallop and no friction rub.   No murmur heard. Pulmonary/Chest: Effort normal and breath sounds normal. No respiratory distress. She has no wheezes. She has no rales.  Abdominal: Soft. Bowel sounds are normal. She exhibits no distension. There is no tenderness. There is no rebound.  Musculoskeletal: Normal range of motion.  Neurological: She is alert and oriented to person, place, and time.  Skin: Skin is warm and dry. No rash noted.  Psychiatric: She has a normal mood and affect. Her behavior is normal.    ED Course  Procedures (including critical care time) Labs Review Labs Reviewed  RAPID STREP SCREEN  CULTURE, GROUP A STREP    Imaging Review No results found.   EKG Interpretation None      MDM   Final diagnoses:  Peritonsillar cellulitis    Clinically a peritonsillar cellulitis. No frank abscess. Strep is negative. IV is placed. Given morphine, Zofran, Decadron, clindamycin with consideration of her penicillin allergy. They should be appropriate for home treatment with pain medicine steroids antibiotics and close recheck if not improving over the next 24-48 hours.    Tanna Furry, MD 01/14/15 0010

## 2015-01-09 NOTE — ED Notes (Signed)
Pt c/o sore throat and fever since yesterday.

## 2015-01-09 NOTE — Discharge Instructions (Signed)
Peritonsillar Cellulitis Peritonsillar cellulitis is an infection around a tonsil. This infection usually affects just one of the two tonsils. The result is a severe sore throat. Peritonsillar cellulitis can develop at any age. It often develops in individuals who have had frequent sore throats and who have frequently taken antibiotics. CAUSES  Peritonsillar cellulitis is usually caused by more than one type of germ (bacteria). SYMPTOMS  At first, it might seem like a regular sore throat. But a sore throat from peritonsillar cellulitis does not go away in a few days. Instead, it gets worse.  Early symptoms of peritonsillar cellulitis may include:  Fever and/or chills.  A throat that is sore on one side only.  Pain in one ear.  Pain when swallowing.  Feeling more tired than usual.  Later symptoms may include:  Severe pain when swallowing.  Drooling.  Trouble opening the mouth wide.  Bad breath.  Voice changes. DIAGNOSIS  In most cases, your caregiver can make the diagnosis by knowing your symptoms, examining your throat and getting a throat culture. Blood samples may also help to determine the cause of your sore throat. TREATMENT  This is not an ordinary sore throat. It is a condition that needs to be treated quickly. If it is not treated, swelling and pus (an abscess) can develop.  Peritonsillar cellulitis is usually treated with antibiotics. These infections require oral antibiotics for a full 10 days and/or antibiotics given into the vein (intravenous, IV).  Medications may be prescribed for pain or fever.  Sometimes, medications that fight swelling (steroids) are prescribed.  If an abscess has formed, the abscess may need to be drained.  Individuals who have repeated cases of peritonsillar cellulitis may need an operation to remove the tonsils (tonsillectomy). HOME CARE INSTRUCTIONS   Take antibiotics as directed by your caregiver. Take all the antibiotics, even if  you start to feel better.  Some pain is normal with this condition. Take pain medication as directed by your caregiver. Do not take any other pain medications unless approved by your caregiver.  Gargle with warm salt water. Use 1 teaspoon (5 grams) salt mixed in 1 cup (250 mL) of warm water. Gargle for 30 seconds or more before spitting the solution out. Gargle 3 to 4 times a day or as needed. This may help ease pain and swelling.  A liquid or soft food diet may be necessary if it is hard to swallow.  It is important to drink fluids. Drink enough water and fluids to keep your urine clear or pale yellow.  Do not smoke.  Rest and get plenty of sleep.  If your caregiver has given you a follow-up appointment, it is very important to keep that appointment. Your caregiver will need to make sure that the infection is getting better. It is important to check that an abscess is not forming.  Return to work or school as directed by your caregiver. SEEK MEDICAL CARE IF:   Your swelling increases.  You have difficulty swallowing.  You are unable to take your antibiotic. SEEK IMMEDIATE MEDICAL CARE IF:   You have trouble breathing.  Your pain gets worse even after taking pain medicine.  You see pus around or near the tonsils.  Your voice changes.  You are drooling.  You cough up bloody sputum.  You are unable to swallow.  You have a fever. MAKE SURE YOU:   Understand these instructions.  Will watch your condition.  Will get help right away if you are  not doing well or get worse. Document Released: 03/09/2010 Document Revised: 04/29/2014 Document Reviewed: 03/09/2010 [facility] Patient Information 2015 [place], [place]. This information is not intended to replace advice given to you by your health care provider. Make sure you discuss any questions you have with your health care provider.

## 2015-01-11 LAB — CULTURE, GROUP A STREP

## 2015-03-23 ENCOUNTER — Encounter (HOSPITAL_COMMUNITY): Payer: Self-pay | Admitting: *Deleted

## 2015-03-23 ENCOUNTER — Inpatient Hospital Stay (HOSPITAL_COMMUNITY)
Admission: AD | Admit: 2015-03-23 | Discharge: 2015-03-23 | Disposition: A | Discharge status: Home / Self Care | Payer: Medicaid Other | Source: Ambulatory Visit | Attending: Family Medicine | Admitting: Family Medicine

## 2015-03-23 DIAGNOSIS — B9689 Other specified bacterial agents as the cause of diseases classified elsewhere: Secondary | ICD-10-CM | POA: Diagnosis not present

## 2015-03-23 DIAGNOSIS — N898 Other specified noninflammatory disorders of vagina: Secondary | ICD-10-CM | POA: Diagnosis not present

## 2015-03-23 DIAGNOSIS — N760 Acute vaginitis: Secondary | ICD-10-CM | POA: Insufficient documentation

## 2015-03-23 DIAGNOSIS — Z880 Allergy status to penicillin: Secondary | ICD-10-CM | POA: Diagnosis not present

## 2015-03-23 DIAGNOSIS — A5901 Trichomonal vulvovaginitis: Secondary | ICD-10-CM

## 2015-03-23 DIAGNOSIS — N939 Abnormal uterine and vaginal bleeding, unspecified: Secondary | ICD-10-CM

## 2015-03-23 DIAGNOSIS — F17210 Nicotine dependence, cigarettes, uncomplicated: Secondary | ICD-10-CM | POA: Diagnosis not present

## 2015-03-23 LAB — URINALYSIS, ROUTINE W REFLEX MICROSCOPIC
Bilirubin Urine: NEGATIVE
Glucose, UA: NEGATIVE mg/dL
Ketones, ur: NEGATIVE mg/dL
Leukocytes, UA: NEGATIVE
Nitrite: NEGATIVE
PROTEIN: NEGATIVE mg/dL
SPECIFIC GRAVITY, URINE: 1.015 (ref 1.005–1.030)
Urobilinogen, UA: 1 mg/dL (ref 0.0–1.0)
pH: 8 (ref 5.0–8.0)

## 2015-03-23 LAB — POCT PREGNANCY, URINE: Preg Test, Ur: NEGATIVE

## 2015-03-23 LAB — WET PREP, GENITAL: Yeast Wet Prep HPF POC: NONE SEEN

## 2015-03-23 LAB — URINE MICROSCOPIC-ADD ON

## 2015-03-23 MED ORDER — METRONIDAZOLE 500 MG PO TABS: 500.0000 mg | ORAL_TABLET | Freq: Two times a day (BID) | ORAL | Status: DC

## 2015-03-23 NOTE — Discharge Instructions (Signed)
Trichomoniasis Trichomoniasis is an infection caused by an organism called Trichomonas. The infection can affect both women and men. In women, the outer female genitalia and the vagina are affected. In men, the penis is mainly affected, but the prostate and other reproductive organs can also be involved. Trichomoniasis is a sexually transmitted infection (STI) and is most often passed to another person through sexual contact.  RISK FACTORS  Having unprotected sexual intercourse.  Having sexual intercourse with an infected partner. SIGNS AND SYMPTOMS  Symptoms of trichomoniasis in women include:  Abnormal gray-green frothy vaginal discharge.  Itching and irritation of the vagina.  Itching and irritation of the area outside the vagina. Symptoms of trichomoniasis in men include:   Penile discharge with or without pain.  Pain during urination. This results from inflammation of the urethra. DIAGNOSIS  Trichomoniasis may be found during a Pap test or physical exam. Your health care provider may use one of the following methods to help diagnose this infection:  Examining vaginal discharge under a microscope. For men, urethral discharge would be examined.  Testing the pH of the vagina with a test tape.  Using a vaginal swab test that checks for the Trichomonas organism. A test is available that provides results within a few minutes.  Doing a culture test for the organism. This is not usually needed. TREATMENT   You may be given medicine to fight the infection. Women should inform their health care provider if they could be or are pregnant. Some medicines used to treat the infection should not be taken during pregnancy.  Your health care provider may recommend over-the-counter medicines or creams to decrease itching or irritation.  Your sexual partner will need to be treated if infected. HOME CARE INSTRUCTIONS   Take medicines only as directed by your health care provider.  Take  over-the-counter medicine for itching or irritation as directed by your health care provider.  Do not have sexual intercourse while you have the infection.  Women should not douche or wear tampons while they have the infection.  Discuss your infection with your partner. Your partner may have gotten the infection from you, or you may have gotten it from your partner.  Have your sex partner get examined and treated if necessary.  Practice safe, informed, and protected sex.  See your health care provider for other STI testing. SEEK MEDICAL CARE IF:   You still have symptoms after you finish your medicine.  You develop abdominal pain.  You have pain when you urinate.  You have bleeding after sexual intercourse.  You develop a rash.  Your medicine makes you sick or makes you throw up (vomit). MAKE SURE YOU:  Understand these instructions.  Will watch your condition.  Will get help right away if you are not doing well or get worse. Document Released: 06/08/2001 Document Revised: 04/29/2014 Document Reviewed: 09/24/2013 [facility] Patient Information 2015 [facility] [place], [place]. This information is not intended to replace advice given to you by your health care provider. Make sure you discuss any questions you have with your health care provider. Bacterial Vaginosis Bacterial vaginosis is an infection of the vagina. It happens when too many of certain germs (bacteria) grow in the vagina. HOME CARE  Take your medicine as told by your doctor.  Finish your medicine even if you start to feel better.  Do not have sex until you finish your medicine and are better.  Tell your sex partner that you have an infection. They should see their doctor for  treatment.  Practice safe sex. Use condoms. Have only one sex partner. GET HELP IF:  You are not getting better after 3 days of treatment.  You have more grey fluid (discharge) coming from your vagina than before.  You have more pain than  before.  You have a fever. MAKE SURE YOU:   Understand these instructions.  Will watch your condition.  Will get help right away if you are not doing well or get worse. Document Released: 09/21/2008 Document Revised: 10/03/2013 Document Reviewed: 07/25/2013 [facility] Patient Information 2015 [place], [place]. This information is not intended to replace advice given to you by your health care provider. Make sure you discuss any questions you have with your health care provider.

## 2015-03-23 NOTE — MAU Provider Note (Signed)
History     CSN: [id number]  Arrival date and time: 03/23/15 1702   First Provider Initiated Contact with Patient 03/23/15 1729      Chief Complaint  Patient presents with  . Vaginal Bleeding  . Abdominal Pain  . Back Pain   HPI  Tammie Brown is 34 y.o. [id number] present for evalution of vaginal bleeding that began at 5 am today.  Describes as light pink, sees with urination.  Neg for flow or clot.  Negative for abdominal pain.  LMP 3/13- normal period for her.  Sexually active 1 partner X 15 months.  Is not using contraception.  A pregnancy be ok.  Is having some back pain started this am too, but that may " be coming from umbilical hernia".   Has urinary frequency of small amounts.  Denies dysuria, fever.  Several "chills" this am.     Past Medical History  Diagnosis Date  . Headache(784.0)   . Heart murmur   . Urinary tract infection   . PID (acute pelvic inflammatory disease)   . Gonorrhea   . Chlamydia     Past Surgical History  Procedure Laterality Date  . Cesarean section    . Appendectomy    . Cesarean section  2001 & 2009    Family History  Problem Relation Age of Onset  . Other Neg Hx   . Hearing loss Neg Hx   . Asthma Mother   . Hypertension Mother   . Diabetes Father   . Cancer Maternal Grandfather     throat    History  Substance Use Topics  . Smoking status: Current Every Day Smoker -- 0.25 packs/day for 15 years    Types: Cigarettes  . Smokeless tobacco: Never Used  . Alcohol Use: Yes     Comment: occassional few times a month  LAST DRANK- FRIDAY-  BEER AND LIQUOR    Allergies:  Allergies  Allergen Reactions  . Penicillins Rash  . Tuberculin Tests Itching, Palpitations and Other (See Comments)    Reaction:  Made pts mouth feel really cold    Prescriptions prior to admission  Medication Sig Dispense Refill Last Dose  . clindamycin (CLEOCIN) 300 MG capsule Take 1 capsule (300 mg total) by mouth 3 (three) times daily. (Patient not taking:  Reported on 03/23/2015) 30 capsule 0   . HYDROcodone-acetaminophen (NORCO/VICODIN) 5-325 MG per tablet Take 2 tablets by mouth every 4 (four) hours as needed. (Patient not taking: Reported on 03/23/2015) 20 tablet 0   . ketorolac (TORADOL) 10 MG tablet Take 1 tablet (10 mg total) by mouth every 6 (six) hours as needed. (Patient not taking: Reported on 01/09/2015) 20 tablet 0 Completed Course at Unknown time  . ondansetron (ZOFRAN ODT) 4 MG disintegrating tablet Take 1 tablet (4 mg total) by mouth every 8 (eight) hours as needed for nausea. (Patient not taking: Reported on 03/23/2015) 6 tablet 0   . predniSONE (DELTASONE) 20 MG tablet Take 1 tablet (20 mg total) by mouth daily with breakfast. (Patient not taking: Reported on 03/23/2015) 10 tablet 0     Review of Systems  Constitutional: Negative for fever and chills.  Gastrointestinal: Negative for nausea, vomiting and abdominal pain.  Genitourinary: Negative for frequency.       + for vaginal spotting this am  Neurological: Negative for headaches.   Physical Exam   Blood pressure 128/63, pulse 84, temperature 97.8 F (36.6 C), resp. rate 18, last menstrual period 03/09/2015.  Physical Exam  Constitutional: She is oriented to person, place, and time. She appears well-developed and well-nourished. No distress.  HENT:  Head: Normocephalic.  Neck: Normal range of motion.  Cardiovascular: Normal rate.   Respiratory: Effort normal.  GI: Soft. She exhibits no distension and no mass. There is no tenderness. There is no rebound and no guarding.  Genitourinary: There is no rash, tenderness or lesion on the right labia. There is no rash, tenderness or lesion on the left labia. Uterus is not enlarged and not tender. Cervix exhibits no motion tenderness, no discharge and no friability. Right adnexum displays no mass, no tenderness and no fullness. Left adnexum displays no mass, no tenderness and no fullness. No erythema, tenderness or bleeding in the  vagina. No foreign body around the vagina. Vaginal discharge (moderate amount of frothy vaginal discharge with foul odor) found.  Neurological: She is alert and oriented to person, place, and time.  Skin: Skin is warm and dry.  Psychiatric: She has a normal mood and affect. Her behavior is normal.    Results for orders placed or performed during the hospital encounter of 03/23/15 (from the past 24 hour(s))  Urinalysis, Routine w reflex microscopic     Status: Abnormal   Collection Time: 03/23/15  5:10 PM  Result Value Ref Range   Color, Urine YELLOW YELLOW   APPearance CLEAR CLEAR   Specific Gravity, Urine 1.015 1.005 - 1.030   pH 8.0 5.0 - 8.0   Glucose, UA NEGATIVE NEGATIVE mg/dL   Hgb urine dipstick TRACE (A) NEGATIVE   Bilirubin Urine NEGATIVE NEGATIVE   Ketones, ur NEGATIVE NEGATIVE mg/dL   Protein, ur NEGATIVE NEGATIVE mg/dL   Urobilinogen, UA 1.0 0.0 - 1.0 mg/dL   Nitrite NEGATIVE NEGATIVE   Leukocytes, UA NEGATIVE NEGATIVE  Urine microscopic-add on     Status: None   Collection Time: 03/23/15  5:10 PM  Result Value Ref Range   Squamous Epithelial / LPF RARE RARE   WBC, UA 0-2 <3 WBC/hpf   RBC / HPF 3-6 <3 RBC/hpf   Urine-Other MUCOUS PRESENT   Pregnancy, urine POC     Status: None   Collection Time: 03/23/15  5:20 PM  Result Value Ref Range   Preg Test, Ur NEGATIVE NEGATIVE  Wet prep, genital     Status: Abnormal   Collection Time: 03/23/15  6:00 PM  Result Value Ref Range   Yeast Wet Prep HPF POC NONE SEEN NONE SEEN   Trich, Wet Prep RARE (A) NONE SEEN   Clue Cells Wet Prep HPF POC MANY (A) NONE SEEN   WBC, Wet Prep HPF POC FEW (A) NONE SEEN   MAU Course  Procedures  GC/CHL culture pending.  Patient declined HIV testing stating she had one recently.  MDM  Discussed contraception with the patient.  She does not want to use contraception because would like a pregnancy  Assessment and Plan  A: vaginal spotting     Bacterial vaginosis     Trichomonal vaginitis     Neg pregnancy test  P:  Rx for Flagyl to pharmacy      Pelvic rest X 1 week      Stressed importance of having partner treated for Trichomonas to prevent reinfection  Treyshawn Muldrew,EVE M 03/23/2015, 6:31 PM

## 2015-03-23 NOTE — MAU Note (Signed)
Pt presents to MAU with complaints of vaginal spotting this am with lower back and abdominal pain. Reports LMP 03/09/15.

## 2015-03-24 LAB — GC/CHLAMYDIA PROBE AMP (~~LOC~~) NOT AT ARMC
CHLAMYDIA, DNA PROBE: NEGATIVE
Neisseria Gonorrhea: NEGATIVE

## 2015-09-10 ENCOUNTER — Encounter (HOSPITAL_COMMUNITY): Payer: Self-pay

## 2015-09-10 ENCOUNTER — Emergency Department (HOSPITAL_COMMUNITY)
Admission: EM | Admit: 2015-09-10 | Discharge: 2015-09-10 | Disposition: A | Discharge status: Home / Self Care | Payer: Medicaid Other | Attending: Emergency Medicine | Admitting: Emergency Medicine

## 2015-09-10 ENCOUNTER — Emergency Department (HOSPITAL_COMMUNITY): Payer: Medicaid Other

## 2015-09-10 DIAGNOSIS — R05 Cough: Secondary | ICD-10-CM | POA: Diagnosis present

## 2015-09-10 DIAGNOSIS — Z880 Allergy status to penicillin: Secondary | ICD-10-CM | POA: Insufficient documentation

## 2015-09-10 DIAGNOSIS — R011 Cardiac murmur, unspecified: Secondary | ICD-10-CM | POA: Insufficient documentation

## 2015-09-10 DIAGNOSIS — Z3202 Encounter for pregnancy test, result negative: Secondary | ICD-10-CM | POA: Diagnosis not present

## 2015-09-10 DIAGNOSIS — Z720 Tobacco use: Secondary | ICD-10-CM | POA: Diagnosis not present

## 2015-09-10 DIAGNOSIS — Z8742 Personal history of other diseases of the female genital tract: Secondary | ICD-10-CM | POA: Insufficient documentation

## 2015-09-10 DIAGNOSIS — Z8619 Personal history of other infectious and parasitic diseases: Secondary | ICD-10-CM | POA: Diagnosis not present

## 2015-09-10 DIAGNOSIS — Z791 Long term (current) use of non-steroidal anti-inflammatories (NSAID): Secondary | ICD-10-CM | POA: Insufficient documentation

## 2015-09-10 DIAGNOSIS — Z87440 Personal history of urinary (tract) infections: Secondary | ICD-10-CM | POA: Diagnosis not present

## 2015-09-10 DIAGNOSIS — Z79899 Other long term (current) drug therapy: Secondary | ICD-10-CM | POA: Diagnosis not present

## 2015-09-10 DIAGNOSIS — R059 Cough, unspecified: Secondary | ICD-10-CM

## 2015-09-10 LAB — BASIC METABOLIC PANEL
Anion gap: 6 (ref 5–15)
BUN: 9 mg/dL (ref 6–20)
CO2: 26 mmol/L (ref 22–32)
CREATININE: 0.7 mg/dL (ref 0.44–1.00)
Calcium: 9.2 mg/dL (ref 8.9–10.3)
Chloride: 109 mmol/L (ref 101–111)
GFR calc Af Amer: >60 mL/min (ref >60)
GFR calc non Af Amer: >60 mL/min (ref >60)
Glucose, Bld: 71 mg/dL (ref 65–99)
Potassium: 3.9 mmol/L (ref 3.5–5.1)
Sodium: 141 mmol/L (ref 135–145)

## 2015-09-10 LAB — HCG, SERUM, QUALITATIVE: Preg, Serum: NEGATIVE

## 2015-09-10 LAB — CBC WITH DIFFERENTIAL/PLATELET
BASOS PCT: 0 %
Basophils Absolute: 0 10*3/uL (ref 0.0–0.1)
Eosinophils Absolute: 0.1 10*3/uL (ref 0.0–0.7)
Eosinophils Relative: 1 %
HEMATOCRIT: 32.6 % — AB (ref 36.0–46.0)
Hemoglobin: 10.2 g/dL — ABNORMAL LOW (ref 12.0–15.0)
Lymphocytes Relative: 14 %
Lymphs Abs: 1.2 10*3/uL (ref 0.7–4.0)
MCH: 24.5 pg — ABNORMAL LOW (ref 26.0–34.0)
MCHC: 31.3 g/dL (ref 30.0–36.0)
MCV: 78.2 fL (ref 78.0–100.0)
MONO ABS: 0.5 10*3/uL (ref 0.1–1.0)
MONOS PCT: 6 %
Neutro Abs: 6.9 10*3/uL (ref 1.7–7.7)
Neutrophils Relative %: 79 %
Platelets: 225 10*3/uL (ref 150–400)
RBC: 4.17 MIL/uL (ref 3.87–5.11)
RDW: 17.2 % — AB (ref 11.5–15.5)
WBC: 8.8 10*3/uL (ref 4.0–10.5)

## 2015-09-10 MED ORDER — ACETAMINOPHEN 325 MG PO TABS
650.0000 mg | ORAL_TABLET | Freq: Once | ORAL | Status: AC
  Administered 2015-09-10: 650 mg via ORAL
  Filled 2015-09-10: qty 2

## 2015-09-10 MED ORDER — KETOROLAC TROMETHAMINE 30 MG/ML IJ SOLN
30.0000 mg | Freq: Once | INTRAMUSCULAR | Status: AC
  Administered 2015-09-10: 30 mg via INTRAVENOUS
  Filled 2015-09-10: qty 1

## 2015-09-10 MED ORDER — SODIUM CHLORIDE 0.9 % IV BOLUS (SEPSIS)
1000.0000 mL | Freq: Once | INTRAVENOUS | Status: AC
  Administered 2015-09-10: 1000 mL via INTRAVENOUS

## 2015-09-10 NOTE — ED Notes (Signed)
She c/o cough with chest discomfort and body aches which started early this morning.  She is in no distress.  EKG performed at triage.

## 2015-09-10 NOTE — ED Provider Notes (Signed)
CSN: [id number]     Arrival date & time 09/10/15  0957 History   First MD Initiated Contact with Patient 09/10/15 1039     Chief Complaint  Patient presents with  . URI     (Consider location/radiation/quality/duration/timing/severity/associated sxs/prior Treatment) HPI  34 year old who presents with cough. Otherwise healthy. Reports feeling unwell yesterday. Developed cough, sore throat, congestion, runny nose, myalgias, chills, diarrhea. Denies fever, vomiting, nausea, dysuria, urinary frequency. Reports chest wall soreness from coughing and difficulty catching her breath after coughing fit.  Past Medical History  Diagnosis Date  . Headache(784.0)   . Heart murmur   . Urinary tract infection   . PID (acute pelvic inflammatory disease)   . Gonorrhea   . Chlamydia    Past Surgical History  Procedure Laterality Date  . Cesarean section    . Appendectomy    . Cesarean section  2001 & 2009   Family History  Problem Relation Age of Onset  . Other Neg Hx   . Hearing loss Neg Hx   . Asthma Mother   . Hypertension Mother   . Diabetes Father   . Cancer Maternal Grandfather     throat   Social History  Substance Use Topics  . Smoking status: Current Every Day Smoker -- 0.25 packs/day for 15 years    Types: Cigarettes  . Smokeless tobacco: Never Used  . Alcohol Use: Yes     Comment: occassional few times a month  LAST DRANK- FRIDAY-  BEER AND LIQUOR   OB History    Gravida Para Term Preterm AB TAB SAB Ectopic Multiple Living   2 2 2       2      Review of Systems 10/14 systems reviewed and are negative other than those stated in the HPI   Allergies  Penicillins and Tuberculin tests  Home Medications   Prior to Admission medications   Medication Sig Start Date End Date Taking? Authorizing Provider  cetirizine (ZYRTEC) 10 MG tablet Take 10 mg by mouth daily.   Yes Historical Provider, MD  naproxen sodium (ANAPROX) 220 MG tablet Take 220 mg by mouth 2 (two) times  daily with a meal.   Yes Historical Provider, MD  metroNIDAZOLE (FLAGYL) 500 MG tablet Take 1 tablet (500 mg total) by mouth 2 (two) times daily. Patient not taking: Reported on 09/10/2015 03/23/15   Nelia Shi Key, NP   BP 135/73 mmHg  Pulse 70  Temp(Src) 98.9 F (37.2 C) (Oral)  Resp 18  Ht 5\' 4"  (1.626 m)  Wt 177 lb (80.287 kg)  BMI 30.37 kg/m2  SpO2 100%  LMP 09/07/2015 (Exact Date) Physical Exam Physical Exam  Nursing note and vitals reviewed. Constitutional: Well developed, well nourished, non-toxic, and in no acute distress Head: Normocephalic and atraumatic.  Mouth/Throat: Oropharynx is clear and dry.  Neck: Normal range of motion. Neck supple.  Cardiovascular: Normal rate and regular rhythm.  No edema. Pulmonary/Chest: Effort normal and breath sounds normal. anterior chest wall discomfort with palpation. Abdominal: Soft. There is no tenderness. There is no rebound and no guarding.  Musculoskeletal: Normal range of motion.  Neurological: Alert, no facial droop, fluent speech, moves all extremities symmetrically Skin: Skin is warm and dry.  Psychiatric: Cooperative   ED Course  Procedures (including critical care time) Labs Review Labs Reviewed  CBC WITH DIFFERENTIAL/PLATELET - Abnormal; Notable for the following:    Hemoglobin 10.2 (*)    HCT 32.6 (*)    MCH 24.5 (*)  RDW 17.2 (*)    All other components within normal limits  BASIC METABOLIC PANEL  HCG, SERUM, QUALITATIVE    Imaging Review Dg Chest 2 View  09/10/2015   CLINICAL DATA:  Cough, shortness of breath.  EXAM: CHEST  2 VIEW  COMPARISON:  05/09/2014  FINDINGS: The heart size and mediastinal contours are within normal limits. Both lungs are clear. The visualized skeletal structures are unremarkable.  IMPRESSION: No active cardiopulmonary disease.   Electronically Signed   By: Rolm Baptise M.D.   On: 09/10/2015 11:17   I have personally reviewed and evaluated these images and lab results as part of my  medical decision-making.   EKG Interpretation   Date/Time:  Wednesday September 10 2015 10:09:41 EDT Ventricular Rate:  79 PR Interval:  137 QRS Duration: 72 QT Interval:  366 QTC Calculation: 419 R Axis:   26 Text Interpretation:  Sinus rhythm Non-specific t-wave changes No  significant change since last tracing Confirmed by Brookley Spitler MD, Hadiyah Maricle [id number]) on  09/10/2015 8:53:13 PM      MDM   Final diagnoses:  Cough     34 year old female who presents with cough. She is well-appearing and in no acute distress, but does appear tired. VS non-concerning. Exam, including cardiopulmonary exam, is unremarkable. CXR showing no PNA or other acute cardiopulmonary processes. Blood work unremarkable. EKG nonischemic, and chest discomfort is MSK in nature from cough and reproducible. Given IVF and toradol for supportive care. She is not pregnant. Discussed outpatient supportive care with patient. Strict return and follow-up instructions reviewed. She expressed understanding of all discharge instructions and felt comfortable with the plan of care.   Forde Dandy, MD 09/10/15 [phone]

## 2015-09-10 NOTE — Discharge Instructions (Signed)
You have a viral or flulike illness. Please get plenty of rest and drink plenty of fluids at home. Return without fail for worsening symptoms, including vomiting unable to keep down food or fluids, worsening pain, confusion, or any other symptoms concerning to you.  Cough, Adult  A cough is a reflex. It helps you clear your throat and airways. A cough can help heal your body. A cough can last 2 or 3 weeks (acute) or may last more than 8 weeks (chronic). Some common causes of a cough can include an infection, allergy, or a cold. HOME CARE  Only take medicine as told by your doctor.  If given, take your medicines (antibiotics) as told. Finish them even if you start to feel better.  Use a cold steam vaporizer or humidifier in your home. This can help loosen thick spit (secretions).  Sleep so you are almost sitting up (semi-upright). Use pillows to do this. This helps reduce coughing.  Rest as needed.  Stop smoking if you smoke. GET HELP RIGHT AWAY IF:  You have yellowish-white fluid (pus) in your thick spit.  Your cough gets worse.  Your medicine does not reduce coughing, and you are losing sleep.  You cough up blood.  You have trouble breathing.  Your pain gets worse and medicine does not help.  You have a fever. MAKE SURE YOU:   Understand these instructions.  Will watch your condition.  Will get help right away if you are not doing well or get worse. Document Released: 08/26/2011 Document Revised: 04/29/2014 Document Reviewed: 08/26/2011 [facility] Patient Information 2015 [place], [place]. This information is not intended to replace advice given to you by your health care provider. Make sure you discuss any questions you have with your health care provider.

## 2016-05-22 ENCOUNTER — Encounter (HOSPITAL_COMMUNITY): Payer: Self-pay | Admitting: *Deleted

## 2016-05-22 ENCOUNTER — Emergency Department (HOSPITAL_COMMUNITY)
Admission: EM | Admit: 2016-05-22 | Discharge: 2016-05-23 | Disposition: A | Discharge status: Home / Self Care | Payer: Medicaid Other | Attending: Emergency Medicine | Admitting: Emergency Medicine

## 2016-05-22 DIAGNOSIS — Z791 Long term (current) use of non-steroidal anti-inflammatories (NSAID): Secondary | ICD-10-CM | POA: Insufficient documentation

## 2016-05-22 DIAGNOSIS — R519 Headache, unspecified: Secondary | ICD-10-CM

## 2016-05-22 DIAGNOSIS — R51 Headache: Secondary | ICD-10-CM | POA: Diagnosis not present

## 2016-05-22 DIAGNOSIS — F17210 Nicotine dependence, cigarettes, uncomplicated: Secondary | ICD-10-CM | POA: Insufficient documentation

## 2016-05-22 LAB — RAPID STREP SCREEN (MED CTR MEBANE ONLY): Streptococcus, Group A Screen (Direct): NEGATIVE

## 2016-05-22 LAB — I-STAT BETA HCG BLOOD, ED (MC, WL, AP ONLY): I-stat hCG, quantitative: <5 m[IU]/mL (ref <5)

## 2016-05-22 MED ORDER — KETOROLAC TROMETHAMINE 60 MG/2ML IM SOLN
60.0000 mg | Freq: Once | INTRAMUSCULAR | Status: AC
  Administered 2016-05-23: 60 mg via INTRAMUSCULAR
  Filled 2016-05-22: qty 2

## 2016-05-22 MED ORDER — ACETAMINOPHEN 500 MG PO TABS
1000.0000 mg | ORAL_TABLET | Freq: Once | ORAL | Status: AC
  Administered 2016-05-22: 1000 mg via ORAL
  Filled 2016-05-22: qty 2

## 2016-05-22 NOTE — ED Notes (Signed)
Pt states that she has had a headache for 2 days; pt denies hx of migraines; pt c/o nausea with no vomiting; pt denies photophobia or sensitivity to sound; pt states that the pain is from her ears forward

## 2016-05-22 NOTE — ED Provider Notes (Signed)
CSN: [id number]     Arrival date & time 05/22/16  2106 History   By signing my name below, I, Maud Deed. Royston Sinner, attest that this documentation has been prepared under the direction and in the presence of Jhace Fennell, MD.  Electronically Signed: Maud Deed. Royston Sinner, ED Scribe. 05/22/2016. 11:16 PM.   Chief Complaint  Patient presents with  . Headache   Patient is a 35 y.o. female presenting with headaches. The history is provided by the patient. No language interpreter was used.  Headache Pain location:  Frontal Quality:  Sharp Radiates to:  Does not radiate Severity currently:  Unable to specify Severity at highest:  Unable to specify Onset quality:  Gradual Duration:  2 days Timing:  Constant Progression:  Unchanged Chronicity:  New Context: bright light   Context: not caffeine   Relieved by:  Nothing Worsened by:  Light Ineffective treatments:  Acetaminophen Associated symptoms: photophobia and sore throat   Associated symptoms: no abdominal pain, no cough, no dizziness, no fever, no nausea, no numbness, no vomiting and no weakness   Risk factors: no anger     HPI Comments: Tammie Brown is a 35 y.o. female without any pertinent past medical history who presents to the Emergency Department complaining of constant, unchanged frontal HA x 2 days. She also reports ongoing sore throat and photophobia. No aggravating or alleviating factors at this time. OTC Tylenol PM attempted at home without any improvement. No recent fever, chills, congestion, itching eyes, or photophobia. No prior history of migraines. Last normal menstrual period 05/11/16.  PCP: No PCP Per Patient    Past Medical History  Diagnosis Date  . Headache(784.0)   . Heart murmur   . Urinary tract infection   . PID (acute pelvic inflammatory disease)   . Gonorrhea   . Chlamydia    Past Surgical History  Procedure Laterality Date  . Cesarean section    . Appendectomy    . Cesarean section  2001 & 2009    Family History  Problem Relation Age of Onset  . Other Neg Hx   . Hearing loss Neg Hx   . Asthma Mother   . Hypertension Mother   . Diabetes Father   . Cancer Maternal Grandfather     throat   Social History  Substance Use Topics  . Smoking status: Current Some Day Smoker -- 0.50 packs/day for 15 years    Types: Cigarettes  . Smokeless tobacco: Never Used  . Alcohol Use: Yes     Comment: occassional few times a month  LAST DRANK- FRIDAY-  BEER AND LIQUOR   OB History    Gravida Para Term Preterm AB TAB SAB Ectopic Multiple Living   2 2 2       2      Review of Systems  Constitutional: Negative for fever and chills.  HENT: Positive for sore throat.   Eyes: Positive for photophobia.  Respiratory: Negative for cough and shortness of breath.   Cardiovascular: Negative for chest pain.  Gastrointestinal: Negative for nausea, vomiting and abdominal pain.  Skin: Negative for rash.  Neurological: Positive for headaches. Negative for dizziness, facial asymmetry, speech difficulty, weakness, light-headedness and numbness.  Psychiatric/Behavioral: Negative for confusion.  All other systems reviewed and are negative.     Allergies  Penicillins and Tuberculin tests  Home Medications   Prior to Admission medications   Medication Sig Start Date End Date Taking? Authorizing Provider  cetirizine (ZYRTEC) 10 MG tablet Take 10 mg  by mouth daily.   Yes Historical Provider, MD  naproxen sodium (ANAPROX) 220 MG tablet Take 220 mg by mouth 2 (two) times daily with a meal.   Yes Historical Provider, MD  metroNIDAZOLE (FLAGYL) 500 MG tablet Take 1 tablet (500 mg total) by mouth 2 (two) times daily. Patient not taking: Reported on 09/10/2015 03/23/15   Nelia Shi Key, NP   Triage Vitals: BP 136/76 mmHg  Pulse 90  Temp(Src) 98.3 F (36.8 C) (Oral)  Resp 18  SpO2 100%  LMP 05/10/2016   Physical Exam  Constitutional: She is oriented to person, place, and time. She appears well-developed  and well-nourished. No distress.  HENT:  Head: Normocephalic and atraumatic.  Mouth/Throat: Oropharynx is clear and moist. No oropharyngeal exudate.  Eyes: Conjunctivae and EOM are normal. Pupils are equal, round, and reactive to light.  Neck: Normal range of motion. Neck supple.  Cardiovascular: Normal rate, regular rhythm and normal heart sounds.   Pulmonary/Chest: Effort normal and breath sounds normal. No respiratory distress. She has no wheezes. She has no rales.  Abdominal: Soft. Bowel sounds are normal. She exhibits no distension. There is no tenderness. There is no rebound and no guarding.  Musculoskeletal: Normal range of motion.  Lymphadenopathy:    She has no cervical adenopathy.  Neurological: She is alert and oriented to person, place, and time. She has normal reflexes.  Skin: Skin is warm and dry.  Psychiatric: She has a normal mood and affect. Judgment normal.  Nursing note and vitals reviewed.   ED Course  Procedures (including critical care time)  DIAGNOSTIC STUDIES: Oxygen Saturation is 100% on RA, Normal by my interpretation.    COORDINATION OF CARE: 11:14 PM- Will order blood work. Will give Tylenol. Discussed treatment plan with pt at bedside and pt agreed to plan.    Labs Review Labs Reviewed - No data to display  Imaging Review No results found. I have personally reviewed and evaluated these images and lab results as part of my medical decision-making.   EKG Interpretation None      MDM   Final diagnoses:  None    Filed Vitals:   05/23/16 0218 05/23/16 0351  BP: 122/63 120/72  Pulse: 63 65  Temp: 98 F (36.7 C)   Resp: 20 18   Results for orders placed or performed during the hospital encounter of 05/22/16  Rapid strep screen  Result Value Ref Range   Streptococcus, Group A Screen (Direct) NEGATIVE NEGATIVE  I-Stat Beta hCG blood, ED ([facility], [facility], [facility] only)  Result Value Ref Range   I-stat hCG, quantitative <5.0 <5 mIU/mL   Comment 3            No results found.  Medications  acetaminophen (TYLENOL) tablet 1,000 mg (1,000 mg Oral Given 05/22/16 2331)  ketorolac (TORADOL) injection 60 mg (60 mg Intramuscular Given 05/23/16 0003)    Better post medication, exam and vitals are benign and reassuring.  Follow up with your PMD.  Strict return precautions.    I personally performed the services described in this documentation, which was scribed in my presence. The recorded information has been reviewed and is accurate.     Veatrice Kells, MD 05/23/16 [phone]

## 2016-05-23 ENCOUNTER — Encounter (HOSPITAL_COMMUNITY): Payer: Self-pay | Admitting: Emergency Medicine

## 2016-05-23 MED ORDER — IBUPROFEN 400 MG PO TABS: 400.0000 mg | ORAL_TABLET | Freq: Four times a day (QID) | ORAL | Status: DC | PRN

## 2016-05-25 LAB — CULTURE, GROUP A STREP (THRC)

## 2016-11-20 ENCOUNTER — Emergency Department (HOSPITAL_COMMUNITY): Payer: Medicaid Other

## 2016-11-20 ENCOUNTER — Emergency Department (HOSPITAL_COMMUNITY)
Admission: EM | Admit: 2016-11-20 | Discharge: 2016-11-20 | Disposition: A | Discharge status: Home / Self Care | Payer: Medicaid Other | Attending: Emergency Medicine | Admitting: Emergency Medicine

## 2016-11-20 ENCOUNTER — Encounter (HOSPITAL_COMMUNITY): Payer: Self-pay | Admitting: Emergency Medicine

## 2016-11-20 DIAGNOSIS — Z79899 Other long term (current) drug therapy: Secondary | ICD-10-CM | POA: Insufficient documentation

## 2016-11-20 DIAGNOSIS — F17210 Nicotine dependence, cigarettes, uncomplicated: Secondary | ICD-10-CM | POA: Insufficient documentation

## 2016-11-20 DIAGNOSIS — R0602 Shortness of breath: Secondary | ICD-10-CM | POA: Diagnosis not present

## 2016-11-20 DIAGNOSIS — R6889 Other general symptoms and signs: Secondary | ICD-10-CM

## 2016-11-20 LAB — COMPREHENSIVE METABOLIC PANEL
ALBUMIN: 4 g/dL (ref 3.5–5.0)
ALK PHOS: 51 U/L (ref 38–126)
ALT: 27 U/L (ref 14–54)
AST: 42 U/L — ABNORMAL HIGH (ref 15–41)
Anion gap: 7 (ref 5–15)
BILIRUBIN TOTAL: 0.4 mg/dL (ref 0.3–1.2)
BUN: 6 mg/dL (ref 6–20)
CALCIUM: 8.8 mg/dL — AB (ref 8.9–10.3)
CO2: 20 mmol/L — ABNORMAL LOW (ref 22–32)
Chloride: 110 mmol/L (ref 101–111)
Creatinine, Ser: 0.77 mg/dL (ref 0.44–1.00)
GFR calc non Af Amer: >60 mL/min (ref >60)
GLUCOSE: 80 mg/dL (ref 65–99)
Potassium: 3.7 mmol/L (ref 3.5–5.1)
Sodium: 137 mmol/L (ref 135–145)
TOTAL PROTEIN: 7.5 g/dL (ref 6.5–8.1)

## 2016-11-20 LAB — URINALYSIS, ROUTINE W REFLEX MICROSCOPIC
Bilirubin Urine: NEGATIVE
GLUCOSE, UA: NEGATIVE mg/dL
HGB URINE DIPSTICK: NEGATIVE
KETONES UR: NEGATIVE mg/dL
Leukocytes, UA: NEGATIVE
Nitrite: NEGATIVE
PROTEIN: NEGATIVE mg/dL
Specific Gravity, Urine: 1.02 (ref 1.005–1.030)
pH: 6 (ref 5.0–8.0)

## 2016-11-20 LAB — I-STAT BETA HCG BLOOD, ED (MC, WL, AP ONLY): I-stat hCG, quantitative: <5 m[IU]/mL (ref <5)

## 2016-11-20 MED ORDER — ACETAMINOPHEN 325 MG PO TABS
650.0000 mg | ORAL_TABLET | Freq: Once | ORAL | Status: AC
  Administered 2016-11-20: 650 mg via ORAL
  Filled 2016-11-20: qty 2

## 2016-11-20 MED ORDER — KETOROLAC TROMETHAMINE 30 MG/ML IJ SOLN
30.0000 mg | Freq: Once | INTRAMUSCULAR | Status: AC
  Administered 2016-11-20: 30 mg via INTRAVENOUS
  Filled 2016-11-20: qty 1

## 2016-11-20 MED ORDER — SODIUM CHLORIDE 0.9 % IV SOLN: INTRAVENOUS | Status: DC

## 2016-11-20 MED ORDER — OSELTAMIVIR PHOSPHATE 75 MG PO CAPS: 75.0000 mg | ORAL_CAPSULE | Freq: Two times a day (BID) | ORAL | 0 refills | Status: DC

## 2016-11-20 MED ORDER — ONDANSETRON HCL 4 MG/2ML IJ SOLN
4.0000 mg | Freq: Once | INTRAMUSCULAR | Status: AC
  Administered 2016-11-20: 4 mg via INTRAVENOUS
  Filled 2016-11-20: qty 2

## 2016-11-20 MED ORDER — SODIUM CHLORIDE 0.9 % IV BOLUS (SEPSIS)
2000.0000 mL | Freq: Once | INTRAVENOUS | Status: AC
  Administered 2016-11-20: 2000 mL via INTRAVENOUS

## 2016-11-20 NOTE — ED Triage Notes (Signed)
Pt presents via EMS from home with c/o diarrhea, generalized body aches, cough, flu-like symptoms. Pt reports she has been feeling like this for approx 2 days. Pt reported to EMS that she took Nyquil last night with no relief. Pt is alert and oriented per EMS. EMS reported that she did feel warm to the touch but no fever reported with their thermometer.

## 2016-11-20 NOTE — ED Provider Notes (Signed)
[facility] DEPT Provider Note   CSN: [id number] Arrival date & time: 11/20/16  [id number]     History   Chief Complaint Chief Complaint  Patient presents with  . Generalized Body Aches  . Cough  . Diarrhea    HPI Tammie Brown is a 35 y.o. female.  35 year old female presents with 48 hour history of cough congestion with watery diarrhea without emesis. Has been short of breath and cough productive of green-yellow secretions. Denies any abdominal discomfort but has had some dysuria. Has had chest congestion. Has used over-the-counter medications without relief. Is any rashes. No neck pain or photophobia. Recent travel history. Mild bilateral ear pain as well as sinus congestion. Nothing makes her symptoms better.      Past Medical History:  Diagnosis Date  . Chlamydia   . Gonorrhea   . Headache(784.0)   . Heart murmur   . PID (acute pelvic inflammatory disease)   . Urinary tract infection     Patient Active Problem List   Diagnosis Date Noted  . Smoker 04/08/2014  . Contraception management 04/08/2014  . Gonorrhea 08/17/2012  . 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 APPENDECTOMY    . CESAREAN SECTION    . CESAREAN SECTION  2001 & 2009    OB History    Gravida Para Term Preterm AB Living   2 2 2     2    SAB TAB Ectopic Multiple Live Births           1       Home Medications    Prior to Admission medications   Medication Sig Start Date End Date Taking? Authorizing Provider  cetirizine (ZYRTEC) 10 MG tablet Take 10 mg by mouth daily.    Historical Provider, MD  ibuprofen (ADVIL,MOTRIN) 400 MG tablet Take 1 tablet (400 mg total) by mouth every 6 (six) hours as needed. 05/23/16   April Palumbo, MD  metroNIDAZOLE (FLAGYL) 500 MG tablet Take 1 tablet (500 mg total) by mouth 2 (two) times daily. Patient not taking: Reported on 09/10/2015 03/23/15   Nelia Shi Key, NP  naproxen sodium (ANAPROX) 220 MG tablet  Take 220 mg by mouth 2 (two) times daily with a meal.    Historical Provider, MD    Family History Family History  Problem Relation Age of Onset  . Asthma Mother   . Hypertension Mother   . Diabetes Father   . Cancer Maternal Grandfather     throat  . Other Neg Hx   . Hearing loss Neg Hx     Social History Social History  Substance Use Topics  . Smoking status: Current Some Day Smoker    Packs/day: 0.50    Years: 15.00    Types: Cigarettes  . Smokeless tobacco: Never Used  . Alcohol use Yes     Comment: occassional few times a month  LAST DRANK- FRIDAY-  BEER AND LIQUOR     Allergies   Penicillins and Tuberculin tests   Review of Systems Review of Systems  All other systems reviewed and are negative.    Physical Exam Updated Vital Signs BP 108/69 (BP Location: Right Arm)   Pulse 92   Temp 99.8 F (37.7 C) (Oral)   Resp 18   LMP 10/27/2016   SpO2 98%   Physical Exam  Constitutional: She is oriented to person, place, and time. She appears well-developed and well-nourished.  Non-toxic appearance. No distress.  HENT:  Head: Normocephalic and atraumatic.  Eyes: Conjunctivae, EOM and lids are normal. Pupils are equal, round, and reactive to light.  Neck: Normal range of motion. Neck supple. No tracheal deviation present. No thyroid mass present.  Cardiovascular: Normal rate, regular rhythm and normal heart sounds.  Exam reveals no gallop.   No murmur heard. Pulmonary/Chest: Effort normal and breath sounds normal. No stridor. No respiratory distress. She has no decreased breath sounds. She has no wheezes. She has no rhonchi. She has no rales.  Abdominal: Soft. Normal appearance and bowel sounds are normal. She exhibits no distension. There is no tenderness. There is no rebound and no CVA tenderness.  Musculoskeletal: Normal range of motion. She exhibits no edema or tenderness.  Neurological: She is alert and oriented to person, place, and time. She has normal  strength. No cranial nerve deficit or sensory deficit. GCS eye subscore is 4. GCS verbal subscore is 5. GCS motor subscore is 6.  Skin: Skin is warm and dry. No abrasion and no rash noted.  Psychiatric: She has a normal mood and affect. Her speech is normal and behavior is normal.  Nursing note and vitals reviewed.    ED Treatments / Results  Labs (all labs ordered are listed, but only abnormal results are displayed) Labs Reviewed  URINE CULTURE  URINALYSIS, ROUTINE W REFLEX MICROSCOPIC (NOT AT [facility])  COMPREHENSIVE METABOLIC PANEL  I-STAT BETA HCG BLOOD, ED ([facility], [facility], [facility] ONLY)    EKG  EKG Interpretation None       Radiology No results found.  Procedures Procedures (including critical care time)  Medications Ordered in ED Medications  acetaminophen (TYLENOL) tablet 650 mg (not administered)  sodium chloride 0.9 % bolus 2,000 mL (not administered)  0.9 %  sodium chloride infusion (not administered)  ondansetron (ZOFRAN) injection 4 mg (not administered)     Initial Impression / Assessment and Plan / ED Course  I have reviewed the triage vital signs and the nursing notes.  Pertinent labs & imaging results that were available during my care of the patient were reviewed by me and considered in my medical decision making (see chart for details).  Clinical Course     Patient medicated here with IV fluids and feels better. Suspect viral illness. Will discharge for Tamiflu  Final Clinical Impressions(s) / ED Diagnoses   Final diagnoses:  SOB (shortness of breath)    New Prescriptions New Prescriptions   No medications on file     Lacretia Leigh, MD 11/20/16 1308

## 2016-11-20 NOTE — ED Notes (Signed)
Apple juice given to pt daughter.

## 2016-11-20 NOTE — ED Notes (Signed)
Pt transported to [facility]; will administer complete orders when pt returns.

## 2016-11-20 NOTE — ED Notes (Signed)
Pt continues to receive fluid boluses. Pt unable to provide urine sample at present time. Pt daughter given cheese per request. Per pt godmother en route to pick up daughter.

## 2016-11-20 NOTE — ED Notes (Signed)
Bed: [id number] Expected date:  Expected time:  Means of arrival:  Comments: 35 yo flu symptoms

## 2016-11-21 LAB — URINE CULTURE: CULTURE: NO GROWTH

## 2017-01-29 ENCOUNTER — Emergency Department (HOSPITAL_COMMUNITY)
Admission: EM | Admit: 2017-01-29 | Discharge: 2017-01-30 | Disposition: A | Discharge status: Home / Self Care | Payer: Medicaid Other | Attending: Emergency Medicine | Admitting: Emergency Medicine

## 2017-01-29 ENCOUNTER — Encounter (HOSPITAL_COMMUNITY): Payer: Self-pay | Admitting: Emergency Medicine

## 2017-01-29 DIAGNOSIS — R1110 Vomiting, unspecified: Secondary | ICD-10-CM | POA: Insufficient documentation

## 2017-01-29 DIAGNOSIS — F17210 Nicotine dependence, cigarettes, uncomplicated: Secondary | ICD-10-CM | POA: Diagnosis not present

## 2017-01-29 DIAGNOSIS — R509 Fever, unspecified: Secondary | ICD-10-CM | POA: Insufficient documentation

## 2017-01-29 DIAGNOSIS — Z79899 Other long term (current) drug therapy: Secondary | ICD-10-CM | POA: Insufficient documentation

## 2017-01-29 DIAGNOSIS — J029 Acute pharyngitis, unspecified: Secondary | ICD-10-CM | POA: Diagnosis not present

## 2017-01-29 DIAGNOSIS — R6889 Other general symptoms and signs: Secondary | ICD-10-CM

## 2017-01-29 LAB — URINALYSIS, ROUTINE W REFLEX MICROSCOPIC
BILIRUBIN URINE: NEGATIVE
GLUCOSE, UA: NEGATIVE mg/dL
Hgb urine dipstick: NEGATIVE
KETONES UR: 20 mg/dL — AB
Leukocytes, UA: NEGATIVE
Nitrite: NEGATIVE
PH: 7 (ref 5.0–8.0)
Protein, ur: NEGATIVE mg/dL
Specific Gravity, Urine: 1.021 (ref 1.005–1.030)

## 2017-01-29 LAB — CBC
HEMATOCRIT: 28.8 % — AB (ref 36.0–46.0)
Hemoglobin: 9.2 g/dL — ABNORMAL LOW (ref 12.0–15.0)
MCH: 24.1 pg — AB (ref 26.0–34.0)
MCHC: 31.9 g/dL (ref 30.0–36.0)
MCV: 75.4 fL — AB (ref 78.0–100.0)
Platelets: 234 10*3/uL (ref 150–400)
RBC: 3.82 MIL/uL — ABNORMAL LOW (ref 3.87–5.11)
RDW: 20.9 % — AB (ref 11.5–15.5)
WBC: 7.9 10*3/uL (ref 4.0–10.5)

## 2017-01-29 LAB — COMPREHENSIVE METABOLIC PANEL
ALT: 34 U/L (ref 14–54)
AST: 40 U/L (ref 15–41)
Albumin: 3.9 g/dL (ref 3.5–5.0)
Alkaline Phosphatase: 42 U/L (ref 38–126)
Anion gap: 7 (ref 5–15)
BILIRUBIN TOTAL: 0.5 mg/dL (ref 0.3–1.2)
BUN: 10 mg/dL (ref 6–20)
CHLORIDE: 106 mmol/L (ref 101–111)
CO2: 21 mmol/L — ABNORMAL LOW (ref 22–32)
Calcium: 8.7 mg/dL — ABNORMAL LOW (ref 8.9–10.3)
Creatinine, Ser: 0.94 mg/dL (ref 0.44–1.00)
GFR calc Af Amer: >60 mL/min (ref >60)
GFR calc non Af Amer: >60 mL/min (ref >60)
GLUCOSE: 79 mg/dL (ref 65–99)
POTASSIUM: 3.7 mmol/L (ref 3.5–5.1)
Sodium: 134 mmol/L — ABNORMAL LOW (ref 135–145)
Total Protein: 6.7 g/dL (ref 6.5–8.1)

## 2017-01-29 LAB — I-STAT BETA HCG BLOOD, ED (MC, WL, AP ONLY): I-stat hCG, quantitative: <5 m[IU]/mL (ref <5)

## 2017-01-29 LAB — LIPASE, BLOOD: LIPASE: 18 U/L (ref 11–51)

## 2017-01-29 MED ORDER — ONDANSETRON HCL 4 MG/2ML IJ SOLN
4.0000 mg | Freq: Once | INTRAMUSCULAR | Status: AC
  Administered 2017-01-29: 4 mg via INTRAVENOUS
  Filled 2017-01-29: qty 2

## 2017-01-29 MED ORDER — SODIUM CHLORIDE 0.9 % IV BOLUS (SEPSIS)
1000.0000 mL | Freq: Once | INTRAVENOUS | Status: AC
  Administered 2017-01-29: 1000 mL via INTRAVENOUS

## 2017-01-29 MED ORDER — KETOROLAC TROMETHAMINE 30 MG/ML IJ SOLN
30.0000 mg | Freq: Once | INTRAMUSCULAR | Status: AC
  Administered 2017-01-29: 30 mg via INTRAVENOUS
  Filled 2017-01-29: qty 1

## 2017-01-29 MED ORDER — OSELTAMIVIR PHOSPHATE 75 MG PO CAPS
75.0000 mg | ORAL_CAPSULE | Freq: Once | ORAL | Status: AC
  Administered 2017-01-29: 75 mg via ORAL
  Filled 2017-01-29: qty 1

## 2017-01-29 MED ORDER — ACETAMINOPHEN 325 MG PO TABS
650.0000 mg | ORAL_TABLET | Freq: Once | ORAL | Status: AC | PRN
Start: 2017-01-29 — End: 2017-01-29
  Administered 2017-01-29: 650 mg via ORAL
  Filled 2017-01-29: qty 2

## 2017-01-29 NOTE — ED Provider Notes (Signed)
[facility] DEPT Provider Note   CSN: [id number] Arrival date & time: 01/29/17  2054  By signing my name below, I, Jeanell Sparrow, attest that this documentation has been prepared under the direction and in the presence of non-physician practitioner, Antonietta Breach, PA-C. Electronically Signed: Jeanell Sparrow, Scribe. 01/29/2017. 9:59 PM.   History   Chief Complaint Chief Complaint  Patient presents with  . Emesis  . Chills    The history is provided by the patient. No language interpreter was used.  Emesis   This is a new problem. The current episode started 6 to 12 hours ago. The problem occurs 2 to 4 times per day. The problem has been gradually improving. The maximum temperature recorded prior to her arrival was 101 to 101.9 F. Risk factors include ill contacts.   HPI Comments: Tammie Brown is a 36 y.o. female who presents to the Emergency Department complaining of vomiting that started about 6 hours ago. She had 2 episodes of vomiting. No modifying factors. She reports associated headache, sore throat, generalized myalgias, chest pain, subjective fever, and diarrhea (3 episodes). Pt's temperature in the ED today was 101.5. She admits to sick contacts. She denies any weakness, blood in stool, hematochezia, or other complaints.   Past Medical History:  Diagnosis Date  . Chlamydia   . Gonorrhea   . Headache(784.0)   . Heart murmur   . PID (acute pelvic inflammatory disease)   . Urinary tract infection     Patient Active Problem List   Diagnosis Date Noted  . Smoker 04/08/2014  . Contraception management 04/08/2014  . Gonorrhea 08/17/2012  . 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 APPENDECTOMY    . CESAREAN SECTION    . CESAREAN SECTION  2001 & 2009    OB History    Gravida Para Term Preterm AB Living   2 2 2     2    SAB TAB Ectopic Multiple Live Births           1       Home Medications    Prior to  Admission medications   Medication Sig Start Date End Date Taking? Authorizing Provider  acetaminophen (TYLENOL) 500 MG tablet Take 2 tablets (1,000 mg total) by mouth every 8 (eight) hours as needed for fever. 01/30/17   Antonietta Breach, PA-C  benzonatate (TESSALON) 100 MG capsule Take 1 capsule (100 mg total) by mouth every 8 (eight) hours. 01/30/17   Antonietta Breach, PA-C  ibuprofen (ADVIL,MOTRIN) 600 MG tablet Take 1 tablet (600 mg total) by mouth every 6 (six) hours as needed for fever, mild pain or moderate pain. 01/30/17   Antonietta Breach, PA-C  oseltamivir (TAMIFLU) 75 MG capsule Take 1 capsule (75 mg total) by mouth every 12 (twelve) hours. 01/30/17   Antonietta Breach, PA-C    Family History Family History  Problem Relation Age of Onset  . Asthma Mother   . Hypertension Mother   . Diabetes Father   . Cancer Maternal Grandfather     throat  . Other Neg Hx   . Hearing loss Neg Hx     Social History Social History  Substance Use Topics  . Smoking status: Current Some Day Smoker    Packs/day: 0.50    Years: 15.00    Types: Cigarettes  . Smokeless tobacco: Never Used  . Alcohol use Yes     Comment: occassional few times a month  LAST DRANK-  FRIDAY-  BEER AND LIQUOR     Allergies   Penicillins and Tuberculin tests   Review of Systems Review of Systems  Ten systems reviewed and are negative for acute change, except as noted in the HPI.    Physical Exam Updated Vital Signs BP 114/61 (BP Location: Right Arm)   Pulse 101   Temp 101.5 F (38.6 C) (Oral)   Resp 20   SpO2 96%   Physical Exam  Constitutional: She is oriented to person, place, and time. She appears well-developed and well-nourished. No distress.  Nontoxic appearing in no acute distress  HENT:  Head: Normocephalic and atraumatic.  Eyes: Conjunctivae and EOM are normal. No scleral icterus.  Neck: Normal range of motion.  No nuchal rigidity or meningismus  Cardiovascular: Normal rate, regular rhythm and intact distal pulses.    Pulmonary/Chest: Effort normal. No respiratory distress. She has no wheezes. She has no rales.  No tachypnea or dyspnea. Lungs grossly clear. Chest expansion symmetric.  Musculoskeletal: Normal range of motion.  Neurological: She is alert and oriented to person, place, and time. She exhibits normal muscle tone. Coordination normal.  Skin: Skin is warm and dry. No rash noted. She is not diaphoretic. No erythema. No pallor.  Psychiatric: She has a normal mood and affect. Her behavior is normal.  Nursing note and vitals reviewed.    ED Treatments / Results  DIAGNOSTIC STUDIES: Oxygen Saturation is 96% on RA, normal by my interpretation.    COORDINATION OF CARE: 10:03 PM- Pt advised of plan for treatment and pt agrees.  Labs (all labs ordered are listed, but only abnormal results are displayed) Labs Reviewed  COMPREHENSIVE METABOLIC PANEL - Abnormal; Notable for the following:       Result Value   Sodium 134 (*)    CO2 21 (*)    Calcium 8.7 (*)    All other components within normal limits  CBC - Abnormal; Notable for the following:    RBC 3.82 (*)    Hemoglobin 9.2 (*)    HCT 28.8 (*)    MCV 75.4 (*)    MCH 24.1 (*)    RDW 20.9 (*)    All other components within normal limits  URINALYSIS, ROUTINE W REFLEX MICROSCOPIC - Abnormal; Notable for the following:    Ketones, ur 20 (*)    All other components within normal limits  LIPASE, BLOOD  I-STAT BETA HCG BLOOD, ED ([facility], [facility], [facility] ONLY)    EKG  EKG Interpretation None       Radiology No results found.  Procedures Procedures (including critical care time)  Medications Ordered in ED Medications  ibuprofen (ADVIL,MOTRIN) tablet 800 mg (not administered)  benzonatate (TESSALON) capsule 100 mg (not administered)  acetaminophen (TYLENOL) tablet 650 mg (650 mg Oral Given 01/29/17 2124)  sodium chloride 0.9 % bolus 1,000 mL (1,000 mLs Intravenous New Bag/Given 01/29/17 2226)  ondansetron (ZOFRAN) injection 4 mg (4 mg  Intravenous Given 01/29/17 2233)  ketorolac (TORADOL) 30 MG/ML injection 30 mg (30 mg Intravenous Given 01/29/17 2233)  oseltamivir (TAMIFLU) capsule 75 mg (75 mg Oral Given 01/29/17 2218)     Initial Impression / Assessment and Plan / ED Course  I have reviewed the triage vital signs and the nursing notes.  Pertinent labs & imaging results that were available during my care of the patient were reviewed by me and considered in my medical decision making (see chart for details).     Patient with symptoms consistent with influenza. Vitals  are stable, low-grade fever. Lungs are clear. No hypoxia. Labs reassuring. Anemia at baseline dating back to 2014. She has had some improvement in symptoms with supportive care in the ED. Patient will be discharged with instructions to orally hydrate, rest, and to take Tamiflu. Patient will also be given a cough suppressant. Primary care follow-up advised for recheck and return precautions given. Patient discharged in stable condition with no unaddressed concerns.   Final Clinical Impressions(s) / ED Diagnoses   Final diagnoses:  Flu-like symptoms    New Prescriptions New Prescriptions   ACETAMINOPHEN (TYLENOL) 500 MG TABLET    Take 2 tablets (1,000 mg total) by mouth every 8 (eight) hours as needed for fever.   BENZONATATE (TESSALON) 100 MG CAPSULE    Take 1 capsule (100 mg total) by mouth every 8 (eight) hours.   IBUPROFEN (ADVIL,MOTRIN) 600 MG TABLET    Take 1 tablet (600 mg total) by mouth every 6 (six) hours as needed for fever, mild pain or moderate pain.   OSELTAMIVIR (TAMIFLU) 75 MG CAPSULE    Take 1 capsule (75 mg total) by mouth every 12 (twelve) hours.   I personally performed the services described in this documentation, which was scribed in my presence. The recorded information has been reviewed and is accurate.       Antonietta Breach, PA-C 01/30/17 0034    Julianne Rice, MD 02/02/17 [phone]

## 2017-01-29 NOTE — ED Triage Notes (Signed)
Per [facility] pt from home onset today N/V/ chills. Reports 2 episodes of emesis today.

## 2017-01-30 MED ORDER — OSELTAMIVIR PHOSPHATE 75 MG PO CAPS: 75.0000 mg | ORAL_CAPSULE | Freq: Two times a day (BID) | ORAL | 0 refills | Status: DC

## 2017-01-30 MED ORDER — IBUPROFEN 800 MG PO TABS
800.0000 mg | ORAL_TABLET | Freq: Once | ORAL | Status: AC
  Administered 2017-01-30: 800 mg via ORAL
  Filled 2017-01-30: qty 1

## 2017-01-30 MED ORDER — BENZONATATE 100 MG PO CAPS
100.0000 mg | ORAL_CAPSULE | Freq: Once | ORAL | Status: AC
Start: 2017-01-30 — End: 2017-01-30
  Administered 2017-01-30: 100 mg via ORAL
  Filled 2017-01-30: qty 1

## 2017-01-30 MED ORDER — ACETAMINOPHEN 500 MG PO TABS: 1000.0000 mg | ORAL_TABLET | Freq: Three times a day (TID) | ORAL | 0 refills | Status: DC | PRN

## 2017-01-30 MED ORDER — IBUPROFEN 600 MG PO TABS: 600.0000 mg | ORAL_TABLET | Freq: Four times a day (QID) | ORAL | 0 refills | Status: DC | PRN

## 2017-01-30 MED ORDER — BENZONATATE 100 MG PO CAPS: 100.0000 mg | ORAL_CAPSULE | Freq: Three times a day (TID) | ORAL | 0 refills | Status: DC

## 2017-03-21 ENCOUNTER — Emergency Department (HOSPITAL_COMMUNITY)
Admission: EM | Admit: 2017-03-21 | Discharge: 2017-03-21 | Disposition: A | Discharge status: Home / Self Care | Payer: Medicaid Other | Attending: Emergency Medicine | Admitting: Emergency Medicine

## 2017-03-21 ENCOUNTER — Encounter (HOSPITAL_COMMUNITY): Payer: Self-pay | Admitting: Emergency Medicine

## 2017-03-21 DIAGNOSIS — J029 Acute pharyngitis, unspecified: Secondary | ICD-10-CM | POA: Diagnosis not present

## 2017-03-21 DIAGNOSIS — F17210 Nicotine dependence, cigarettes, uncomplicated: Secondary | ICD-10-CM | POA: Diagnosis not present

## 2017-03-21 DIAGNOSIS — J069 Acute upper respiratory infection, unspecified: Secondary | ICD-10-CM | POA: Diagnosis not present

## 2017-03-21 LAB — RAPID STREP SCREEN (MED CTR MEBANE ONLY): Streptococcus, Group A Screen (Direct): NEGATIVE

## 2017-03-21 MED ORDER — CETIRIZINE-PSEUDOEPHEDRINE ER 5-120 MG PO TB12: 1.0000 | ORAL_TABLET | Freq: Two times a day (BID) | ORAL | 0 refills | Status: DC

## 2017-03-21 MED ORDER — IBUPROFEN 200 MG PO TABS
600.0000 mg | ORAL_TABLET | Freq: Once | ORAL | Status: AC
  Administered 2017-03-21: 600 mg via ORAL
  Filled 2017-03-21: qty 3

## 2017-03-21 MED ORDER — IBUPROFEN 600 MG PO TABS: 600.0000 mg | ORAL_TABLET | Freq: Four times a day (QID) | ORAL | 0 refills | Status: DC | PRN

## 2017-03-21 NOTE — ED Provider Notes (Signed)
[facility] DEPT Provider Note   CSN: [id number] Arrival date & time: 03/21/17  1128  By signing my name below, I, Jeanell Sparrow, attest that this documentation has been prepared under the direction and in the presence of non-physician practitioner, Armstead Peaks, PA-C. Electronically Signed: Jeanell Sparrow, Scribe. 03/21/2017. 12:44 PM.  History   Chief Complaint Chief Complaint  Patient presents with  . Nasal Congestion  . Sore Throat   The history is provided by the patient. No language interpreter was used.   HPI Comments: Tammie Brown is a 36 y.o. female who presents to the Emergency Department complaining of constant moderate sore throat that started 2 days ago. She states initially suspected allergies. She took Benadryl PTA without relief. She reports associated sneezing, headache (pressure sensation), left ear pain, and neck pain. Her daughter at home has similar symptoms. She denies any fever, cough, SOB, chest pain, abdominal pain, nausea, vomiting, or other complaints.   Past Medical History:  Diagnosis Date  . Chlamydia   . Gonorrhea   . Headache(784.0)   . Heart murmur   . PID (acute pelvic inflammatory disease)   . Urinary tract infection     Patient Active Problem List   Diagnosis Date Noted  . Smoker 04/08/2014  . Contraception management 04/08/2014  . Gonorrhea 08/17/2012  . 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 APPENDECTOMY    . CESAREAN SECTION    . CESAREAN SECTION  2001 & 2009    OB History    Gravida Para Term Preterm AB Living   2 2 2     2    SAB TAB Ectopic Multiple Live Births           1       Home Medications    Prior to Admission medications   Medication Sig Start Date End Date Taking? Authorizing Provider  acetaminophen (TYLENOL) 500 MG tablet Take 2 tablets (1,000 mg total) by mouth every 8 (eight) hours as needed for fever. 01/30/17   Antonietta Breach, PA-C  benzonatate (TESSALON)  100 MG capsule Take 1 capsule (100 mg total) by mouth every 8 (eight) hours. 01/30/17   Antonietta Breach, PA-C  cetirizine-pseudoephedrine (ZYRTEC-D) 5-120 MG tablet Take 1 tablet by mouth 2 (two) times daily. 03/21/17   Frederica Kuster, PA-C  ibuprofen (ADVIL,MOTRIN) 600 MG tablet Take 1 tablet (600 mg total) by mouth every 6 (six) hours as needed for fever, mild pain or moderate pain. 03/21/17   Frederica Kuster, PA-C  oseltamivir (TAMIFLU) 75 MG capsule Take 1 capsule (75 mg total) by mouth every 12 (twelve) hours. 01/30/17   Antonietta Breach, PA-C    Family History Family History  Problem Relation Age of Onset  . Asthma Mother   . Hypertension Mother   . Diabetes Father   . Cancer Maternal Grandfather     throat  . Other Neg Hx   . Hearing loss Neg Hx     Social History Social History  Substance Use Topics  . Smoking status: Current Some Day Smoker    Packs/day: 0.50    Years: 15.00    Types: Cigarettes  . Smokeless tobacco: Never Used  . Alcohol use Yes     Comment: occassional few times a month  LAST DRANK- FRIDAY-  BEER AND LIQUOR     Allergies   Penicillins and Tuberculin tests   Review of Systems Review of Systems  Constitutional: Negative for fever.  HENT: Positive for ear pain (Left ear), sneezing and sore throat.   Respiratory: Negative for cough and shortness of breath.   Cardiovascular: Negative for chest pain.  Gastrointestinal: Negative for abdominal pain, nausea and vomiting.  Musculoskeletal: Positive for neck pain.  Neurological: Positive for headaches.  Psychiatric/Behavioral: The patient is not nervous/anxious.      Physical Exam Updated Vital Signs BP 120/64 (BP Location: Left Arm)   Pulse 80   Temp 98.1 F (36.7 C) (Oral)   Resp 18   Ht 5\' 4"  (1.626 m)   Wt 178 lb (80.7 kg)   LMP 02/09/2017   SpO2 99%   BMI 30.55 kg/m   Physical Exam  Constitutional: She appears well-developed and well-nourished. No distress.  HENT:  Head: Normocephalic and  atraumatic.  Right Ear: Tympanic membrane, external ear and ear canal normal.  Left Ear: Tympanic membrane, external ear and ear canal normal.  Mouth/Throat: No trismus in the jaw. Posterior oropharyngeal edema (Mild) and posterior oropharyngeal erythema present. No oropharyngeal exudate or tonsillar abscesses. No tonsillar exudate.  Eyes: Conjunctivae are normal. Pupils are equal, round, and reactive to light. Right eye exhibits no discharge. Left eye exhibits no discharge. No scleral icterus.  Neck: Normal range of motion. Neck supple. No spinous process tenderness present. No neck rigidity. Normal range of motion present. No thyromegaly present.  No meningismus  Cardiovascular: Normal rate, regular rhythm, normal heart sounds and intact distal pulses.  Exam reveals no gallop and no friction rub.   No murmur heard. Pulmonary/Chest: Effort normal and breath sounds normal. No stridor. No respiratory distress. She has no wheezes. She has no rales.  Abdominal: Soft. Bowel sounds are normal. She exhibits no distension. There is no tenderness. There is no rebound and no guarding.  Musculoskeletal: She exhibits no edema.  Lymphadenopathy:    She has no cervical adenopathy.  Neurological: She is alert. Coordination normal.  Skin: Skin is warm and dry. No rash noted. She is not diaphoretic. No pallor.  Psychiatric: She has a normal mood and affect.  Nursing note and vitals reviewed.    ED Treatments / Results  DIAGNOSTIC STUDIES: Oxygen Saturation is 99% on RA, normal by my interpretation.    COORDINATION OF CARE: 12:48 PM- Pt advised of plan for treatment and pt agrees.  Labs (all labs ordered are listed, but only abnormal results are displayed) Labs Reviewed  RAPID STREP SCREEN (NOT AT [facility])  CULTURE, GROUP A STREP [facility])    EKG  EKG Interpretation None       Radiology No results found.  Procedures Procedures (including critical care time)  Medications Ordered in  ED Medications  ibuprofen (ADVIL,MOTRIN) tablet 600 mg (600 mg Oral Given 03/21/17 1434)     Initial Impression / Assessment and Plan / ED Course  I have reviewed the triage vital signs and the nursing notes.  Pertinent labs & imaging results that were available during my care of the patient were reviewed by me and considered in my medical decision making (see chart for details).     Pt with negative strep. Diagnosis of viral pharyngitis or other URI. No abx indicated at this time. Discussed that results of strep culture are pending and patient will be informed if positive result and abx will be called in at that time. Discharge with symptomatic tx, including ibuprofen, Tylenol, and Zyrtec-D for congestion. No evidence of dehydration. Pt is tolerating secretions. Presentation not concerning for peritonsillar abscess or spread of infection to deep spaces  of the throat; patent airway. Specific return precautions discussed. Recommended PCP follow up. Pt appears safe for discharge.   Final Clinical Impressions(s) / ED Diagnoses   Final diagnoses:  Upper respiratory tract infection, unspecified type  Sore throat    New Prescriptions Discharge Medication List as of 03/21/2017  2:00 PM    START taking these medications   Details  cetirizine-pseudoephedrine (ZYRTEC-D) 5-120 MG tablet Take 1 tablet by mouth 2 (two) times daily., Starting Mon 03/21/2017, Print       I personally performed the services described in this documentation, which was scribed in my presence. The recorded information has been reviewed and is accurate.     Frederica Kuster, PA-C 03/21/17 1946    Dorie Rank, MD 03/22/17 1302

## 2017-03-21 NOTE — Discharge Instructions (Signed)
Medications: Zyrtec-D, ibuprofen  Treatment: Take Zyrtec-D twice daily as prescribed. Take ibuprofen every 6 hours as needed for your sore throat and pain. You can also use over-the-counter Chloraseptic spray and warm saltwater gargles to help with your throat pain. Make sure to drink plenty of water. A strep culture has been sent and if positive, you will be called in 2-3 days if a antibiotic is required for treatment.  Follow-up: Please return to the emergency department if you develop any new or worsening symptoms.

## 2017-03-21 NOTE — ED Triage Notes (Signed)
Pt complaint of congestion and sore throat onset 2 days ago.

## 2017-03-22 LAB — CULTURE, GROUP A STREP (THRC)

## 2017-03-23 ENCOUNTER — Telehealth: Payer: Self-pay | Admitting: Emergency Medicine

## 2017-03-23 NOTE — Progress Notes (Signed)
ED Antimicrobial Stewardship Positive Culture Follow Up   Tammie Brown is an 36 y.o. female who presented to [facility] on 03/21/2017 with a chief complaint of  Chief Complaint  Patient presents with  . Nasal Congestion  . Sore Throat    Recent Results (from the past 720 hour(s))  Rapid strep screen     Status: None   Collection Time: 03/21/17 12:53 PM  Result Value Ref Range Status   Streptococcus, Group A Screen (Direct) NEGATIVE NEGATIVE Final    Comment: (NOTE) A Rapid Antigen test may result negative if the antigen level in the sample is below the detection level of this test. The FDA has not cleared this test as a stand-alone test therefore the rapid antigen negative result has reflexed to a Group A Strep culture.   Culture, group A strep     Status: None   Collection Time: 03/21/17 12:53 PM  Result Value Ref Range Status   Specimen Description THROAT  Final   Special Requests NONE Reflexed from [id number]  Final   Culture FEW GROUP A STREP (S.PYOGENES) ISOLATED  Final   Report Status 03/22/2017 FINAL  Final    [x]  Patient discharged originally without antimicrobial agent and treatment is now indicated  New antibiotic prescription: Azithromycin 500 mg daily for 5 days  ED Provider: Ocie Cornfield, PA-C  [name] 03/23/2017, 8:43 AM Infectious Diseases Pharmacist Phone# [phone]

## 2017-03-23 NOTE — Telephone Encounter (Addendum)
Post ED Visit - Positive Culture Follow-up: Successful Patient Follow-Up  Culture assessed and recommendations reviewed by: []  Elenor Quinones, Pharm.D. []  Heide Guile, Pharm.D., BCPS AQ-ID []  Parks Neptune, Pharm.D., BCPS [x]  Alycia Rossetti, Pharm.D., BCPS []  [place], Pharm.D., BCPS, AAHIVP []  Legrand Como, Pharm.D., BCPS, AAHIVP []  Salome Arnt, PharmD, BCPS []  Dimitri Ped, PharmD, BCPS []  Vincenza Hews, PharmD, BCPS  Positive strep culture  [x]  Patient discharged without antimicrobial prescription and treatment is now indicated []  Organism is resistant to prescribed ED discharge antimicrobial []  Patient with positive blood cultures  Changes discussed with ED provider: Ocie Cornfield PA New antibiotic prescription start Azithromycin 500mg  daily x 5 days  Attempting to contact patient   Hazle Nordmann 03/23/2017, 10:44 AM

## 2017-03-24 ENCOUNTER — Telehealth: Payer: Self-pay | Admitting: *Deleted

## 2017-03-24 NOTE — Telephone Encounter (Signed)
Post ED Visit - Positive Culture Follow-up: Successful Patient Follow-Up  Culture assessed and recommendations reviewed by: []  Elenor Quinones, Pharm.D. []  Heide Guile, Pharm.D., BCPS AQ-ID []  Parks Neptune, Pharm.D., BCPS [x]  Alycia Rossetti, Pharm.D., BCPS []  [place], Pharm.D., BCPS, AAHIVP []  Legrand Como, Pharm.D., BCPS, AAHIVP []  Salome Arnt, PharmD, BCPS []  Dimitri Ped, PharmD, BCPS []  Vincenza Hews, PharmD, BCPS  Positive urine culture  [x]  Patient discharged without antimicrobial prescription and treatment is now indicated [x]  Organism is resistant to prescribed ED discharge antimicrobial []  Patient with positive blood cultures  Changes discussed with ED provider Ocie Cornfield, PA New antibiotic prescription Azithromycin 500mg /day x 5 days Called to [facility], [place] [phone] Contacted patient, date 03/24/2017, time [place], [place] 03/24/2017, 12:40 PM

## 2017-09-01 ENCOUNTER — Emergency Department (HOSPITAL_COMMUNITY): Payer: Medicaid Other

## 2017-09-01 ENCOUNTER — Encounter (HOSPITAL_COMMUNITY): Payer: Self-pay | Admitting: Emergency Medicine

## 2017-09-01 ENCOUNTER — Emergency Department (HOSPITAL_COMMUNITY)
Admission: EM | Admit: 2017-09-01 | Discharge: 2017-09-01 | Disposition: A | Discharge status: Home / Self Care | Payer: Medicaid Other | Attending: Emergency Medicine | Admitting: Emergency Medicine

## 2017-09-01 DIAGNOSIS — R509 Fever, unspecified: Secondary | ICD-10-CM | POA: Diagnosis not present

## 2017-09-01 DIAGNOSIS — F17210 Nicotine dependence, cigarettes, uncomplicated: Secondary | ICD-10-CM | POA: Insufficient documentation

## 2017-09-01 DIAGNOSIS — R05 Cough: Secondary | ICD-10-CM | POA: Insufficient documentation

## 2017-09-01 DIAGNOSIS — M549 Dorsalgia, unspecified: Secondary | ICD-10-CM | POA: Diagnosis not present

## 2017-09-01 DIAGNOSIS — J029 Acute pharyngitis, unspecified: Secondary | ICD-10-CM | POA: Diagnosis present

## 2017-09-01 DIAGNOSIS — R079 Chest pain, unspecified: Secondary | ICD-10-CM

## 2017-09-01 LAB — BASIC METABOLIC PANEL
Anion gap: 7 (ref 5–15)
BUN: 11 mg/dL (ref 6–20)
CO2: 22 mmol/L (ref 22–32)
Calcium: 8.9 mg/dL (ref 8.9–10.3)
Chloride: 105 mmol/L (ref 101–111)
Creatinine, Ser: 0.82 mg/dL (ref 0.44–1.00)
GFR calc Af Amer: >60 mL/min (ref >60)
GFR calc non Af Amer: >60 mL/min (ref >60)
Glucose, Bld: 87 mg/dL (ref 65–99)
Potassium: 3.4 mmol/L — ABNORMAL LOW (ref 3.5–5.1)
Sodium: 134 mmol/L — ABNORMAL LOW (ref 135–145)

## 2017-09-01 LAB — CBC
HCT: 32.2 % — ABNORMAL LOW (ref 36.0–46.0)
Hemoglobin: 10.1 g/dL — ABNORMAL LOW (ref 12.0–15.0)
MCH: 23.1 pg — ABNORMAL LOW (ref 26.0–34.0)
MCHC: 31.4 g/dL (ref 30.0–36.0)
MCV: 73.7 fL — ABNORMAL LOW (ref 78.0–100.0)
Platelets: 273 10*3/uL (ref 150–400)
RBC: 4.37 MIL/uL (ref 3.87–5.11)
RDW: 18.7 % — ABNORMAL HIGH (ref 11.5–15.5)
WBC: 7.1 10*3/uL (ref 4.0–10.5)

## 2017-09-01 LAB — POCT I-STAT TROPONIN I: TROPONIN I, POC: 0 ng/mL (ref 0.00–0.08)

## 2017-09-01 LAB — RAPID STREP SCREEN (MED CTR MEBANE ONLY): Streptococcus, Group A Screen (Direct): NEGATIVE

## 2017-09-01 MED ORDER — HYDROMORPHONE HCL 1 MG/ML IJ SOLN
1.0000 mg | Freq: Once | INTRAMUSCULAR | Status: AC
  Administered 2017-09-01: 1 mg via INTRAMUSCULAR
  Filled 2017-09-01: qty 1

## 2017-09-01 MED ORDER — DEXAMETHASONE 1 MG/ML PO CONC
10.0000 mg | Freq: Once | ORAL | Status: AC
  Administered 2017-09-01: 10 mg via ORAL
  Filled 2017-09-01: qty 10

## 2017-09-01 MED ORDER — KETOROLAC TROMETHAMINE 15 MG/ML IJ SOLN
30.0000 mg | Freq: Once | INTRAMUSCULAR | Status: AC
  Administered 2017-09-01: 30 mg via INTRAMUSCULAR
  Filled 2017-09-01: qty 2

## 2017-09-01 MED ORDER — MELOXICAM 15 MG PO TABS: 15.0000 mg | ORAL_TABLET | Freq: Every day | ORAL | 0 refills | Status: DC | PRN

## 2017-09-01 NOTE — ED Triage Notes (Signed)
Pt reports she began to have a sore throat yesterday. This has progressed to neck/back pain. Began to have CP this am. No SOB. Endorses dizziness.

## 2017-09-01 NOTE — ED Notes (Signed)
Called twice for blood draw no answer

## 2017-09-01 NOTE — ED Notes (Signed)
Patient transported to main X-ray

## 2017-09-03 LAB — CULTURE, GROUP A STREP (THRC)

## 2017-09-18 NOTE — ED Provider Notes (Signed)
[facility] DEPT Provider Note   CSN: [id number] Arrival date & time: 09/01/17  2585     History   Chief Complaint Chief Complaint  Patient presents with  . Sore Throat  . Back Pain  . Chest Pain    HPI Tammie Brown is a 36 y.o. female.  HPI   36 year old female with sore throat. Onset yesterday. Persistent since then. Has regressed to neck and upper back pain. Subjective fever. Coughing. No shortness of breath. No dizziness or lightheadedness.  Past Medical History:  Diagnosis Date  . Chlamydia   . Gonorrhea   . Headache(784.0)   . Heart murmur   . PID (acute pelvic inflammatory disease)   . Urinary tract infection     Patient Active Problem List   Diagnosis Date Noted  . Smoker 04/08/2014  . Contraception management 04/08/2014  . Gonorrhea 08/17/2012  . 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 APPENDECTOMY    . CESAREAN SECTION    . CESAREAN SECTION  2001 & 2009    OB History    Gravida Para Term Preterm AB Living   2 2 2     2    SAB TAB Ectopic Multiple Live Births           1       Home Medications    Prior to Admission medications   Medication Sig Start Date End Date Taking? Authorizing Provider  ibuprofen (ADVIL,MOTRIN) 200 MG tablet Take 200 mg by mouth every 6 (six) hours as needed for mild pain.   Yes [provider]  Menthol (HALLS COUGH DROPS MT) Use as directed 1 tablet in the mouth or throat every 6 (six) hours as needed (sore throat).   Yes [provider]  meloxicam (MOBIC) 15 MG tablet Take 1 tablet (15 mg total) by mouth daily as needed for pain. 09/01/17   Virgel Manifold, MD    Family History Family History  Problem Relation Age of Onset  . Asthma Mother   . Hypertension Mother   . Diabetes Father   . Cancer Maternal Grandfather        throat  . Other Neg Hx   . Hearing loss Neg Hx     Social History Social History  Substance Use Topics  . Smoking  status: Current Some Day Smoker    Packs/day: 0.50    Years: 15.00    Types: Cigarettes  . Smokeless tobacco: Never Used  . Alcohol use Yes     Comment: occassional few times a month  LAST DRANK- FRIDAY-  BEER AND LIQUOR     Allergies   Penicillins and Tuberculin tests   Review of Systems Review of Systems   Physical Exam Updated Vital Signs BP 122/78 (BP Location: Right Arm)   Pulse 69   Temp 98.4 F (36.9 C) (Oral)   Resp 18   LMP 08/13/2017 (Approximate)   SpO2 99%   Physical Exam  Constitutional: She appears well-developed and well-nourished. No distress.  HENT:  Head: Normocephalic and atraumatic.  Perhaps a mild pharyngitis. No exudate. Uvula is midline. Normal sounding voice. Handling secretions. Neck is supple. No adenopathy. No stridor.  Eyes: Conjunctivae are normal. Right eye exhibits no discharge. Left eye exhibits no discharge.  Neck: Neck supple.  Cardiovascular: Normal rate, regular rhythm and normal heart sounds.  Exam reveals no gallop and no friction rub.   No murmur heard. Pulmonary/Chest: Effort normal and breath  sounds normal. No respiratory distress.  Abdominal: Soft. She exhibits no distension. There is no tenderness.  Musculoskeletal: She exhibits no edema or tenderness.  Neurological: She is alert.  Skin: Skin is warm and dry.  Psychiatric: She has a normal mood and affect. Her behavior is normal. Thought content normal.  Nursing note and vitals reviewed.    ED Treatments / Results  Labs (all labs ordered are listed, but only abnormal results are displayed) Labs Reviewed  BASIC METABOLIC PANEL - Abnormal; Notable for the following:       Result Value   Sodium 134 (*)    Potassium 3.4 (*)    All other components within normal limits  CBC - Abnormal; Notable for the following:    Hemoglobin 10.1 (*)    HCT 32.2 (*)    MCV 73.7 (*)    MCH 23.1 (*)    RDW 18.7 (*)    All other components within normal limits  RAPID STREP SCREEN (NOT  AT [facility])  CULTURE, GROUP A STREP [facility])  I-STAT TROPONIN, ED  POCT I-STAT TROPONIN I    EKG  EKG Interpretation  Date/Time:  Thursday September 01 2017 09:26:09 EDT Ventricular Rate:  60 PR Interval:    QRS Duration: 79 QT Interval:  422 QTC Calculation: 422 R Axis:   38 Text Interpretation:  Sinus rhythm mild ST segment changes Confirmed by Malvin Johns [id number]) on 09/02/2017 4:13:13 PM       Radiology No results found.  Procedures Procedures (including critical care time)  Medications Ordered in ED Medications  HYDROmorphone (DILAUDID) injection 1 mg (1 mg Intramuscular Given 09/01/17 1418)  ketorolac (TORADOL) 15 MG/ML injection 30 mg (30 mg Intramuscular Given 09/01/17 1417)  dexamethasone (DECADRON) 1 MG/ML solution 10 mg (10 mg Oral Given 09/01/17 1417)     Initial Impression / Assessment and Plan / ED Course  I have reviewed the triage vital signs and the nursing notes.  Pertinent labs & imaging results that were available during my care of the patient were reviewed by me and considered in my medical decision making (see chart for details).  36 year old female with sore throats. Suspect viral pharyngitis. Rapid strep is negative. Exam is very reassuring. I doubt deep space neck infection. Symptomatically treatment. Return precautions discussed.  Final Clinical Impressions(s) / ED Diagnoses   Final diagnoses:  Sore throat  Chest pain, unspecified type    New Prescriptions Discharge Medication List as of 09/01/2017  2:25 PM    START taking these medications   Details  meloxicam (MOBIC) 15 MG tablet Take 1 tablet (15 mg total) by mouth daily as needed for pain., Starting Thu 09/01/2017, Print         Virgel Manifold, MD 09/18/17 [phone]

## 2018-03-27 ENCOUNTER — Emergency Department (HOSPITAL_COMMUNITY)
Admission: EM | Admit: 2018-03-27 | Discharge: 2018-03-27 | Disposition: A | Discharge status: Home / Self Care | Payer: Medicaid Other | Attending: Emergency Medicine | Admitting: Emergency Medicine

## 2018-03-27 ENCOUNTER — Other Ambulatory Visit: Payer: Self-pay

## 2018-03-27 ENCOUNTER — Emergency Department (HOSPITAL_COMMUNITY): Payer: Medicaid Other

## 2018-03-27 ENCOUNTER — Encounter (HOSPITAL_COMMUNITY): Payer: Self-pay

## 2018-03-27 DIAGNOSIS — F17210 Nicotine dependence, cigarettes, uncomplicated: Secondary | ICD-10-CM | POA: Insufficient documentation

## 2018-03-27 DIAGNOSIS — M79672 Pain in left foot: Secondary | ICD-10-CM | POA: Diagnosis present

## 2018-03-27 DIAGNOSIS — L84 Corns and callosities: Secondary | ICD-10-CM

## 2018-03-27 MED ORDER — IBUPROFEN 800 MG PO TABS
800.0000 mg | ORAL_TABLET | Freq: Once | ORAL | Status: AC
Start: 2018-03-27 — End: 2018-03-27
  Administered 2018-03-27: 800 mg via ORAL
  Filled 2018-03-27: qty 1

## 2018-03-27 MED ORDER — OXYCODONE-ACETAMINOPHEN 5-325 MG PO TABS
1.0000 | ORAL_TABLET | Freq: Once | ORAL | Status: AC
Start: 2018-03-27 — End: 2018-03-27
  Administered 2018-03-27: 1 via ORAL
  Filled 2018-03-27: qty 1

## 2018-03-27 NOTE — Discharge Instructions (Addendum)
X-ray without any broken bones.  Please use warm soaks as we discussed.  You can take ibuprofen for pain.  Please schedule an appointment with the podiatrist for further evaluation and management.  Return to the emergency department if you have any new or concerning symptoms including fever, redness around the foot.

## 2018-03-27 NOTE — ED Notes (Signed)
Pt called out stating that her kids get out of school at 2:45pm and was wondering when the provider was coming to see her. Delay was informed to pt.  I have informed PA Raquel Sarna.

## 2018-03-27 NOTE — ED Provider Notes (Signed)
[facility] DEPT Provider Note   CSN: [id number] Arrival date & time: 03/27/18  1014     History   Chief Complaint Chief Complaint  Patient presents with  . Foot Pain    HPI Tammie Brown is a 37 y.o. female.  HPI  Tammie Brown is a 37 year old female with no significant past medical history who presents to the emergency department for pain on the bottom of her left foot.  Patient noticed that she had a painful callus under her foot a couple of months ago, states she used hydrogen peroxide and wash the foot with subsequent improvement.  Today, she states that the callus has become more painful.  Reports that pain is severe, feels throbbing.  Pain is acutely worsened when she bears weight on the foot or touches the callus.  She has not taken any over-the-counter medications for her symptoms.  States that she thinks that the top of the foot appears swollen as well.  She denies any recent injury to the foot.  She denies fevers, chills, numbness, weakness.  Past Medical History:  Diagnosis Date  . Chlamydia   . Gonorrhea   . Headache(784.0)   . Heart murmur   . PID (acute pelvic inflammatory disease)   . Urinary tract infection     Patient Active Problem List   Diagnosis Date Noted  . Smoker 04/08/2014  . Contraception management 04/08/2014  . Gonorrhea 08/17/2012  . 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 APPENDECTOMY    . CESAREAN SECTION    . CESAREAN SECTION  2001 & 2009     OB History    Gravida  2   Para  2   Term  2   Preterm      AB      Living  2     SAB      TAB      Ectopic      Multiple      Live Births  1            Home Medications    Prior to Admission medications   Medication Sig Start Date End Date Taking? Authorizing Provider  ibuprofen (ADVIL,MOTRIN) 200 MG tablet Take 200 mg by mouth every 6 (six) hours as needed for mild pain.    [provider]  meloxicam (MOBIC) 15 MG tablet Take 1 tablet (15 mg total) by mouth daily as needed for pain. 09/01/17   Virgel Manifold, MD  Menthol (HALLS COUGH DROPS MT) Use as directed 1 tablet in the mouth or throat every 6 (six) hours as needed (sore throat).    [provider]    Family History Family History  Problem Relation Age of Onset  . Asthma Mother   . Hypertension Mother   . Diabetes Father   . Cancer Maternal Grandfather        throat  . Other Neg Hx   . Hearing loss Neg Hx     Social History Social History   Tobacco Use  . Smoking status: Current Some Day Smoker    Packs/day: 0.50    Years: 15.00    Pack years: 7.50    Types: Cigarettes  . Smokeless tobacco: Never Used  Substance Use Topics  . Alcohol use: Yes    Comment: occassional few times a month  LAST DRANK- FRIDAY-  BEER AND LIQUOR  . Drug use: Yes  Frequency: 2.0 times per week    Types: Marijuana    Comment: LAST SMOKED - MONDAY     Allergies   Penicillins and Tuberculin tests   Review of Systems Review of Systems  Constitutional: Negative for chills and fever.  Musculoskeletal: Positive for arthralgias (pain over plantar aspect of left foot) and gait problem (painful).  Skin: Negative for wound.  Neurological: Negative for weakness and numbness.     Physical Exam Updated Vital Signs BP 122/62 (BP Location: Right Arm)   Pulse 80   Temp 97.9 F (36.6 C)   Resp 16   Ht 5\' 5"  (1.651 m)   Wt 82.6 kg (182 lb)   LMP 03/14/2018   SpO2 100%   BMI 30.29 kg/m   Physical Exam  Constitutional: She appears well-developed and well-nourished. No distress.  HENT:  Head: Normocephalic and atraumatic.  Eyes: Right eye exhibits no discharge. Left eye exhibits no discharge.  Pulmonary/Chest: Effort normal. No respiratory distress.  Musculoskeletal:  Tender to palpation over 1 cm rough callus on the plantar aspect of the left foot.  No surrounding erythema, warmth or induration.  No  break in skin.  Full active range of motion of the ankle, although painful with dorsiflexion.  No swelling or tenderness on the dorsal aspect of the foot. No tenderness over the navicular bone or base of fifth metatarsal.  Achilles tendon intact.  DP pulses 2+ bilaterally.  Cap refill <2sec. distal sensation to light touch intact in bilateral lower extremities.  Neurological: She is alert. Coordination normal.  Skin: Skin is warm and dry. Capillary refill takes less than 2 seconds. She is not diaphoretic.  Psychiatric: She has a normal mood and affect. Her behavior is normal.  Nursing note and vitals reviewed.    ED Treatments / Results  Labs (all labs ordered are listed, but only abnormal results are displayed) Labs Reviewed - No data to display  EKG None  Radiology Dg Foot Complete Left  Result Date: 03/27/2018 CLINICAL DATA:  Plantar foot pain EXAM: LEFT FOOT - COMPLETE 3+ VIEW COMPARISON:  09/17/2009 FINDINGS: Small os peroneus. No specific articular abnormality. No appreciable fracture or malalignment. No foreign body observed. Chronic spur fragmentation along the proximal-dorsal navicular. Very small Achilles and calcaneal spurs are present. IMPRESSION: 1. Chronic fragmented navicular spur in the vicinity of the attachment of the dorsal talonavicular ligament. 2. Small os peroneus. 3. No other specific finding is evident to explain the patient's symptoms. If pain persists despite conservative therapy, MRI may be warranted for further characterization. Electronically Signed   By: Van Clines M.D.   On: 03/27/2018 14:33    Procedures Procedures (including critical care time)  Medications Ordered in ED Medications  oxyCODONE-acetaminophen (PERCOCET/ROXICET) 5-325 MG per tablet 1 tablet (1 tablet Oral Given 03/27/18 1337)  ibuprofen (ADVIL,MOTRIN) tablet 800 mg (800 mg Oral Given 03/27/18 1337)     Initial Impression / Assessment and Plan / ED Course  I have reviewed the triage  vital signs and the nursing notes.  Pertinent labs & imaging results that were available during my care of the patient were reviewed by me and considered in my medical decision making (see chart for details).    She presents with what a painful callus on the bottom of her foot. No break in skin, warmth or erythema to suggest superimposed infection.  Foot is neurovascularly intact on exam.  X-ray of left foot without acute fracture or abnormality. It does show a chronic spur on  the navicular bone, although patient does not have tenderness over this area on exam and do not think this is contributing to her current complaint.  Plan to have her follow-up with podiatry.  Have discussed warm soaks and ibuprofen for pain.  Patient agrees and voiced understanding to the above plan.  Final Clinical Impressions(s) / ED Diagnoses   Final diagnoses:  Callus of foot    ED Discharge Orders    None       Bernarda Caffey 03/27/18 1448    Nat Christen, MD 03/31/18 1352

## 2018-03-27 NOTE — ED Notes (Signed)
Bed: WTR7 Expected date:  Expected time:  Means of arrival:  Comments: 

## 2018-03-27 NOTE — ED Notes (Signed)
Patient has had x-ray performed at bedside.

## 2018-03-27 NOTE — ED Notes (Signed)
Patient called out inquiring about how long it would be before radiology came to get her for x-ray. Explained to the patient that the radiology department was working as promptly as possible with the patient volume of the department. Patient further reported she would need to leave soon to pick her children up and see if the provider could discharge her with some "pain medication". Informed provider of this information.

## 2018-03-27 NOTE — ED Triage Notes (Signed)
Patient c/o left foot pain and swelling. Patient has a protrusion from the bottom of the left foot. Patient states she has pain around the protruding area.

## 2018-04-13 ENCOUNTER — Ambulatory Visit: Payer: Medicaid Other | Admitting: Podiatry

## 2018-04-21 ENCOUNTER — Encounter: Payer: Self-pay | Admitting: Podiatry

## 2018-04-21 ENCOUNTER — Ambulatory Visit: Payer: Medicaid Other | Admitting: Podiatry

## 2018-04-21 ENCOUNTER — Ambulatory Visit (INDEPENDENT_AMBULATORY_CARE_PROVIDER_SITE_OTHER): Payer: Medicaid Other

## 2018-04-21 DIAGNOSIS — M722 Plantar fascial fibromatosis: Secondary | ICD-10-CM | POA: Diagnosis not present

## 2018-04-21 DIAGNOSIS — L923 Foreign body granuloma of the skin and subcutaneous tissue: Secondary | ICD-10-CM

## 2018-04-21 DIAGNOSIS — D219 Benign neoplasm of connective and other soft tissue, unspecified: Secondary | ICD-10-CM

## 2018-04-21 DIAGNOSIS — L921 Necrobiosis lipoidica, not elsewhere classified: Secondary | ICD-10-CM | POA: Diagnosis not present

## 2018-04-23 NOTE — Progress Notes (Signed)
  Subjective:  Patient ID: Tammie Brown, female    DOB: [birth year],  MRN: [id number]  Chief Complaint  Patient presents with  . Foot Pain    painful knot/ cyst on the bottom of left foot, present since january, painful and increasing in size   37 y.o. female presents with the above complaint.  Reports painful knot on the bottom of left foot.  Present since January.  Worsening.  Hurts with her shoes.  Reports is increasing in size Past Medical History:  Diagnosis Date  . Chlamydia   . Gonorrhea   . Headache(784.0)   . Heart murmur   . PID (acute pelvic inflammatory disease)   . Urinary tract infection    Past Surgical History:  Procedure Laterality Date  . APPENDECTOMY    . CESAREAN SECTION    . CESAREAN SECTION  2001 & 2009    Current Outpatient Medications:  .  ibuprofen (ADVIL,MOTRIN) 200 MG tablet, Take 200 mg by mouth every 6 (six) hours as needed for mild pain., Disp: , Rfl:  .  meloxicam (MOBIC) 15 MG tablet, Take 1 tablet (15 mg total) by mouth daily as needed for pain., Disp: 7 tablet, Rfl: 0 .  Menthol (HALLS COUGH DROPS MT), Use as directed 1 tablet in the mouth or throat every 6 (six) hours as needed (sore throat)., Disp: , Rfl:   Allergies  Allergen Reactions  . Penicillins Rash    Has patient had a PCN reaction causing immediate rash, facial/tongue/throat swelling, SOB or lightheadedness with hypotension: No Has patient had a PCN reaction causing severe rash involving mucus membranes or skin necrosis: yes mouth Has patient had a PCN reaction that required hospitalization No Has patient had a PCN reaction occurring within the last 10 years: Yes 5 or 6 years ago If all of the above answers are "NO", then may proceed with Cephalosporin use.   . Tuberculin Tests Itching, Palpitations and Other (See Comments)    Reaction:  Made pts mouth feel really cold   Review of Systems: Negative except as noted in the HPI. Denies N/V/F/Ch. Objective:  There were no vitals  filed for this visit. General AA&O x3. Normal mood and affect.  Vascular Dorsalis pedis and posterior tibial pulses  present 2+ bilaterally  Capillary refill normal to all digits. Pedal hair growth normal.  Neurologic Epicritic sensation grossly present.  Dermatologic No open lesions. Interspaces clear of maceration. Nails well groomed and normal in appearance.  Orthopedic: MMT 5/5 in dorsiflexion, plantarflexion, inversion, and eversion. Normal joint ROM without pain or crepitus. Painful large firm nodule along the medial arch L foot   Assessment & Plan:  Patient was evaluated and treated and all questions answered.  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 -Discussed with patient possible removal however discussed that I would not consider removal while patient smokes due to increased risk of wound dehiscence on the bottom of the foot  Return in about 1 month (around 05/19/2018) for MRI F/u.

## 2018-04-24 ENCOUNTER — Telehealth: Payer: Self-pay | Admitting: *Deleted

## 2018-04-24 DIAGNOSIS — M722 Plantar fascial fibromatosis: Secondary | ICD-10-CM

## 2018-04-24 NOTE — Telephone Encounter (Signed)
-----   Message from Evelina Bucy, DPM sent at 04/23/2018  6:21 PM EDT ----- Regarding: MRi Can we order MRI for patient? Its a fibroma in the midfoot - usually I guess we get ankle MRI to evaluate the plantar fascia? Maybe need to do both MRI ankle and Foot.

## 2018-04-26 NOTE — Telephone Encounter (Signed)
[employer] - Medicaid requires clinicals for prre-cert review, Service [phone]. Faxed clinicals and orders to [facility].

## 2018-04-27 ENCOUNTER — Ambulatory Visit: Payer: Medicaid Other | Admitting: Podiatry

## 2018-04-28 ENCOUNTER — Encounter: Payer: Self-pay | Admitting: *Deleted

## 2018-04-28 NOTE — Telephone Encounter (Signed)
Unable to leave message for pt to call and schedule MRI, MRI has been approved.

## 2018-05-05 ENCOUNTER — Encounter: Payer: Self-pay | Admitting: Podiatry

## 2018-05-05 ENCOUNTER — Ambulatory Visit: Payer: Medicaid Other | Admitting: Podiatry

## 2018-05-05 ENCOUNTER — Telehealth: Payer: Self-pay | Admitting: *Deleted

## 2018-05-05 DIAGNOSIS — M722 Plantar fascial fibromatosis: Secondary | ICD-10-CM | POA: Diagnosis not present

## 2018-05-05 NOTE — Telephone Encounter (Signed)
Pt called for the appt information, and I informed she was to go to [facility] Wednesday 05/10/2018 at 11:15am for 11:40am appt. Pt states understanding.

## 2018-05-05 NOTE — Telephone Encounter (Addendum)
[facility] scheduled 05/10/2018 arrive 11:15am for 11:40am MRI.

## 2018-05-05 NOTE — Telephone Encounter (Signed)
Unable to leave a message with appt date and time, voicemail box is not set up on [phone].

## 2018-05-05 NOTE — Progress Notes (Signed)
  Subjective:  Patient ID: Tammie Brown, female    DOB: 17-Jan-1981,  MRN: [id number]  Chief Complaint  Patient presents with  . Foot Pain    Follow up plantar fibroma left   "Its burning and throbbing more"  . NOTE    Patient said she did not get the MRI done-hasn't heard about an appointment   37 y.o. female returns for the above complaint. Didn't have MRI performed. States she never got a call.  Objective:  There were no vitals filed for this visit. General AA&O x3. Normal mood and affect.  Vascular Pedal pulses palpable.  Neurologic Epicritic sensation grossly intact.  Dermatologic No open lesions. Skin normal texture and turgor.  Orthopedic: Painful firm nodule along L medial arch   Assessment & Plan:  Patient was evaluated and treated and all questions answered.  Plantar Fibroma L -MRI pending -Will get scheduled for patient. -Discussed continued smoking cessation. Advised we cannot proceed with procedure while she smokes  Return in about 1 month (around 06/02/2018) for MRI F/u.

## 2018-05-05 NOTE — Telephone Encounter (Signed)
Left message on [phone] cell phone per pt's request with [facility] 05/10/2018 appt and time.

## 2018-05-10 ENCOUNTER — Ambulatory Visit
Admission: RE | Admit: 2018-05-10 | Discharge: 2018-05-10 | Disposition: A | Discharge status: Home / Self Care | Payer: Medicaid Other | Source: Ambulatory Visit | Attending: Podiatry | Admitting: Podiatry

## 2018-05-10 ENCOUNTER — Other Ambulatory Visit: Payer: Self-pay | Admitting: Podiatry

## 2018-05-10 DIAGNOSIS — M722 Plantar fascial fibromatosis: Secondary | ICD-10-CM

## 2018-05-10 MED ORDER — GADOBENATE DIMEGLUMINE 529 MG/ML IV SOLN: 17.0000 mL | Freq: Once | INTRAVENOUS | Status: DC | PRN

## 2018-05-11 ENCOUNTER — Other Ambulatory Visit: Payer: Self-pay

## 2018-05-11 ENCOUNTER — Encounter (HOSPITAL_COMMUNITY): Payer: Self-pay

## 2018-05-11 ENCOUNTER — Emergency Department (HOSPITAL_COMMUNITY)
Admission: EM | Admit: 2018-05-11 | Discharge: 2018-05-11 | Disposition: A | Discharge status: Home / Self Care | Payer: Medicaid Other | Attending: Emergency Medicine | Admitting: Emergency Medicine

## 2018-05-11 DIAGNOSIS — M542 Cervicalgia: Secondary | ICD-10-CM | POA: Diagnosis present

## 2018-05-11 DIAGNOSIS — R202 Paresthesia of skin: Secondary | ICD-10-CM | POA: Diagnosis not present

## 2018-05-11 DIAGNOSIS — M5412 Radiculopathy, cervical region: Secondary | ICD-10-CM

## 2018-05-11 DIAGNOSIS — F17210 Nicotine dependence, cigarettes, uncomplicated: Secondary | ICD-10-CM | POA: Diagnosis not present

## 2018-05-11 DIAGNOSIS — Z79899 Other long term (current) drug therapy: Secondary | ICD-10-CM | POA: Insufficient documentation

## 2018-05-11 MED ORDER — METHOCARBAMOL 500 MG PO TABS: 500.0000 mg | ORAL_TABLET | Freq: Two times a day (BID) | ORAL | 0 refills | Status: DC

## 2018-05-11 MED ORDER — ACETAMINOPHEN 500 MG PO TABS: 500.0000 mg | ORAL_TABLET | Freq: Four times a day (QID) | ORAL | 0 refills | Status: DC | PRN

## 2018-05-11 MED ORDER — IBUPROFEN 600 MG PO TABS: 600.0000 mg | ORAL_TABLET | Freq: Four times a day (QID) | ORAL | 0 refills | Status: DC | PRN

## 2018-05-11 NOTE — ED Notes (Signed)
When walking back to the room, patient requested "a note for work before she leaves."

## 2018-05-11 NOTE — Discharge Instructions (Signed)
Medications: Ibuprofen, Tylenol, Robaxin  Treatment: Take ibuprofen and Tylenol alternating as prescribed.  Take Robaxin twice daily as needed for muscle pain or spasms.  Do not drive or operate machinery while taking this medication.  Use ice and heat alternating 20 minutes on, 20 minutes off.  Attempt the stretches we discussed several times daily.  Follow-up: Please follow-up and establish care with a primary care provider for further evaluation and treatment of your symptoms are not improving.  Please return to the emergency department if you develop any new or worsening symptoms.

## 2018-05-11 NOTE — ED Provider Notes (Signed)
[facility] EMERGENCY DEPARTMENT Provider Note   CSN: [id number] Arrival date & time: 05/11/18  1112     History   Chief Complaint No chief complaint on file.   HPI Tammie Brown is a 37 y.o. female who presents with a 3-year history of neck pain and 2-day history of intermittent paresthesias to the right hand.  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  She now works at [employer].  Her pain is worse with movement.  She occasionally has right upper chest pain associated with the pain as well.  She denies any shortness of breath.  She has no pain at this time.  She has been taking ibuprofen at home with some relief.  She denies any noticeable weakness.  HPI  Past Medical History:  Diagnosis Date  . Chlamydia   . Gonorrhea   . Headache(784.0)   . Heart murmur   . PID (acute pelvic inflammatory disease)   . Urinary tract infection     Patient Active Problem List   Diagnosis Date Noted  . Smoker 04/08/2014  . Contraception management 04/08/2014  . Gonorrhea 08/17/2012  . 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 APPENDECTOMY    . CESAREAN SECTION    . CESAREAN SECTION  2001 & 2009     OB History    Gravida  2   Para  2   Term  2   Preterm      AB      Living  2     SAB      TAB      Ectopic      Multiple      Live Births  1            Home Medications    Prior to Admission medications   Medication Sig Start Date End Date Taking? Authorizing Provider  acetaminophen (TYLENOL) 500 MG tablet Take 1 tablet (500 mg total) by mouth every 6 (six) hours as needed. 05/11/18   Endy Easterly, Bea Graff, PA-C  ibuprofen (ADVIL,MOTRIN) 600 MG tablet Take 1 tablet (600 mg total) by mouth every 6 (six) hours as needed. 05/11/18   Annah Jasko, Bea Graff, PA-C  Menthol (HALLS COUGH DROPS MT) Use as directed 1 tablet in the mouth or throat every 6  (six) hours as needed (sore throat).    [provider]  methocarbamol (ROBAXIN) 500 MG tablet Take 1 tablet (500 mg total) by mouth 2 (two) times daily. 05/11/18   Frederica Kuster, PA-C    Family History Family History  Problem Relation Age of Onset  . Asthma Mother   . Hypertension Mother   . Diabetes Father   . Cancer Maternal Grandfather        throat  . Other Neg Hx   . Hearing loss Neg Hx     Social History Social History   Tobacco Use  . Smoking status: Current Some Day Smoker    Packs/day: 0.50    Years: 15.00    Pack years: 7.50    Types: Cigarettes  . Smokeless tobacco: Never Used  Substance Use Topics  . Alcohol use: Yes    Comment: occassional few times a month  LAST DRANK- FRIDAY-  BEER AND LIQUOR  . Drug use: Yes    Frequency: 2.0 times per week    Types: Marijuana  Comment: LAST SMOKED - MONDAY     Allergies   Penicillins and Tuberculin tests   Review of Systems Review of Systems  Constitutional: Negative for fever.  Musculoskeletal: Positive for neck pain.  Neurological: Positive for numbness (paresthesia only).     Physical Exam Updated Vital Signs BP 125/62   Pulse 70   Temp 98.1 F (36.7 C)   Resp 18   LMP 04/28/2018   SpO2 100%   Physical Exam  Constitutional: She appears well-developed and well-nourished. No distress.  HENT:  Head: Normocephalic and atraumatic.  Mouth/Throat: Oropharynx is clear and moist. No oropharyngeal exudate.  Eyes: Pupils are equal, round, and reactive to light. Conjunctivae are normal. Right eye exhibits no discharge. Left eye exhibits no discharge. No scleral icterus.  Neck: Normal range of motion. Neck supple. No thyromegaly present.  Cardiovascular: Normal rate, regular rhythm, normal heart sounds and intact distal pulses. Exam reveals no gallop and no friction rub.  No murmur heard. Pulmonary/Chest: Effort normal and breath sounds normal. No stridor. No respiratory distress. She has no  wheezes. She has no rales.  Musculoskeletal: She exhibits no edema.  No midline cervical, thoracic, or lumbar tenderness No tenderness on palpation of the right shoulder  Lymphadenopathy:    She has no cervical adenopathy.  Neurological: She is alert. Coordination normal.  Normal sensation to bilateral upper extremities, equal bilateral grip strength; 5/5 strength to bilateral upper extremity  Skin: Skin is warm and dry. No rash noted. She is not diaphoretic. No pallor.  Psychiatric: She has a normal mood and affect.  Nursing note and vitals reviewed.    ED Treatments / Results  Labs (all labs ordered are listed, but only abnormal results are displayed) Labs Reviewed - No data to display  EKG None  Radiology Mr Foot Left Wo Contrast  Result Date: 05/10/2018 CLINICAL DATA:  The patient reports a small, painful lump on the plantar surface of the foot since January, 2019. EXAM: MRI OF THE LEFT FOOT WITHOUT CONTRAST TECHNIQUE: Multiplanar, multisequence MR imaging of the ankle was performed. No intravenous contrast was administered. COMPARISON:  Plain films of the left foot 04/21/2018. FINDINGS: This examination was ordered with contrast but the patient could not tolerate further scanning no contrast was given. TENDONS Peroneal: Intact. Posteromedial: Intact. Anterior: Intact. Achilles: Intact. Plantar Fascia: Intact. A well-circumscribed nodule measuring 1 cm craniocaudal by 1.4 cm long by 1.6 cm transverse is seen subjacent to a marker placed in the region of concern. The nodule is contiguous with the plantar fascia. LIGAMENTS Lateral: Intact. Medial: Intact. CARTILAGE Ankle Joint: Normal. Subtalar Joints/Sinus Tarsi: Normal. Bones: No fracture or worrisome lesion. There is some osteophytosis about the talonavicular joint. Other: None. IMPRESSION: Well-circumscribed nodule in the region of concern on the plantar surface of the foot could be a plantar fibroma but it does demonstrate somewhat  atypically increased T2 signal for a fibroma. Sebaceous cyst or ganglion cyst is also possible. Mild talonavicular degenerative disease. Electronically Signed   By: Inge Rise M.D.   On: 05/10/2018 15:13    Procedures Procedures (including critical care time)  Medications Ordered in ED Medications - No data to display   Initial Impression / Assessment and Plan / ED Course  I have reviewed the triage vital signs and the nursing notes.  Pertinent labs & imaging results that were available during my care of the patient were reviewed by me and considered in my medical decision making (see chart for details).  Patient with suspected cervical radiculopathy.  She has normal sensation and equal bilateral grip strength.  5/5 strength.  No weakness or numbness indicating imaging at this time.  No midline tenderness.  Will refer patient to PCP.  Continue ibuprofen and add Robaxin, Tylenol, stretching, ice, heat.  Return precautions discussed.  Patient understands and agrees with plan.  Patient vitals stable throughout ED course and discharged in satisfactory condition.  Final Clinical Impressions(s) / ED Diagnoses   Final diagnoses:  Radiculopathy, cervical    ED Discharge Orders        Ordered    ibuprofen (ADVIL,MOTRIN) 600 MG tablet  Every 6 hours PRN     05/11/18 1213    acetaminophen (TYLENOL) 500 MG tablet  Every 6 hours PRN     05/11/18 1213    methocarbamol (ROBAXIN) 500 MG tablet  2 times daily     05/11/18 [address], [place] 05/11/18 1217    Jola Schmidt, MD 05/12/18 1724

## 2018-05-11 NOTE — ED Triage Notes (Signed)
Pt presents with complaints of neck pain x 3 years. Denies any injury. States numbness in fingers x 2 days on right hand side.

## 2018-05-25 ENCOUNTER — Ambulatory Visit (INDEPENDENT_AMBULATORY_CARE_PROVIDER_SITE_OTHER): Payer: Medicaid Other | Admitting: Podiatry

## 2018-05-25 DIAGNOSIS — M722 Plantar fascial fibromatosis: Secondary | ICD-10-CM

## 2018-05-25 DIAGNOSIS — D219 Benign neoplasm of connective and other soft tissue, unspecified: Secondary | ICD-10-CM

## 2018-05-25 NOTE — Progress Notes (Signed)
  Subjective:  Patient ID: Tammie Brown, female    DOB: 24-Dec-1981,  MRN: [id number]  Chief Complaint  Patient presents with  . Plantar Fasciitis    not any better - MRI results today   37 y.o. female returns for the above complaint. Had MRI performed here for review. Very nervous. Also has been smoke free x2 weeks.  Objective:  There were no vitals filed for this visit. General AA&O x3. Normal mood and affect.  Vascular Pedal pulses palpable.  Neurologic Epicritic sensation grossly intact.  Dermatologic No open lesions. Skin normal texture and turgor.  Orthopedic: Painful firm nodule along L medial arch   Assessment & Plan:  Patient was evaluated and treated and all questions answered.  Plantar Fibroma L -MRI reviewed. Will plan for removal of the plantar fibroma. All r/b/a discussed. No guarantees given. Specifically advised of risk of recurrence. Pt verbalized understanding. -Continue smoking cessation.  Return for post op.

## 2018-05-25 NOTE — Patient Instructions (Signed)
Pre-Operative Instructions  Congratulations, you have decided to take an important step towards improving your quality of life.  You can be assured that the doctors and staff at [facility] will be with you every step of the way.  Here are some important things you should know:  1. Plan to be at the surgery center/hospital at least 1 (one) hour prior to your scheduled time, unless otherwise directed by the surgical center/hospital staff.  You must have a responsible adult accompany you, remain during the surgery and drive you home.  Make sure you have directions to the surgical center/hospital to ensure you arrive on time. 2. If you are having surgery at [facility] or [facility], you will need a copy of your medical history and physical form from your family physician within one month prior to the date of surgery. We will give you a form for your primary physician to complete.  3. We make every effort to accommodate the date you request for surgery.  However, there are times where surgery dates or times have to be moved.  We will contact you as soon as possible if a change in schedule is required.   4. No aspirin/ibuprofen for one week before surgery.  If you are on aspirin, any non-steroidal anti-inflammatory medications (Mobic, Aleve, Ibuprofen) should not be taken seven (7) days prior to your surgery.  You make take Tylenol for pain prior to surgery.  5. Medications - If you are taking daily heart and blood pressure medications, seizure, reflux, allergy, asthma, anxiety, pain or diabetes medications, make sure you notify the surgery center/hospital before the day of surgery so they can tell you which medications you should take or avoid the day of surgery. 6. No food or drink after midnight the night before surgery unless directed otherwise by surgical center/hospital staff. 7. No alcoholic beverages 24-hours prior to surgery.  No smoking 24-hours prior or 24-hours after  surgery. 8. Wear loose pants or shorts. They should be loose enough to fit over bandages, boots, and casts. 9. Don't wear slip-on shoes. Sneakers are preferred. 10. Bring your boot with you to the surgery center/hospital.  Also bring crutches or a walker if your physician has prescribed it for you.  If you do not have this equipment, it will be provided for you after surgery. 11. If you have not been contacted by the surgery center/hospital by the day before your surgery, call to confirm the date and time of your surgery. 12. Leave-time from work may vary depending on the type of surgery you have.  Appropriate arrangements should be made prior to surgery with your employer. 13. Prescriptions will be provided immediately following surgery by your doctor.  Fill these as soon as possible after surgery and take the medication as directed. Pain medications will not be refilled on weekends and must be approved by the doctor. 14. Remove nail polish on the operative foot and avoid getting pedicures prior to surgery. 15. Wash the night before surgery.  The night before surgery wash the foot and leg well with water and the antibacterial soap provided. Be sure to pay special attention to beneath the toenails and in between the toes.  Wash for at least three (3) minutes. Rinse thoroughly with water and dry well with a towel.  Perform this wash unless told not to do so by your physician.  Enclosed: 1 Ice pack (please put in freezer the night before surgery)   1 Hibiclens skin cleaner     Pre-op instructions  If you have any questions regarding the instructions, please do not hesitate to call our office.  [place]: [address], [place], [place] [address] -- [phone]  [place]: [address] [place], [place] [address] -- [phone]  [place]: [address]  [place], [place] [address] -- [phone]  [place]: [address], [address], [place], [place] [address] -- [phone]  Website:  [email or web address] 

## 2018-06-14 ENCOUNTER — Telehealth: Payer: Self-pay | Admitting: Podiatry

## 2018-06-14 ENCOUNTER — Other Ambulatory Visit: Payer: Self-pay | Admitting: Podiatry

## 2018-06-14 ENCOUNTER — Encounter: Payer: Self-pay | Admitting: Podiatry

## 2018-06-14 DIAGNOSIS — D492 Neoplasm of unspecified behavior of bone, soft tissue, and skin: Secondary | ICD-10-CM | POA: Diagnosis not present

## 2018-06-14 MED ORDER — CLINDAMYCIN HCL 150 MG PO CAPS
150.0000 mg | ORAL_CAPSULE | Freq: Two times a day (BID) | ORAL | 0 refills | Status: DC
Start: 2018-06-14 — End: 2018-12-14

## 2018-06-14 MED ORDER — OXYCODONE-ACETAMINOPHEN 10-325 MG PO TABS: 1.0000 | ORAL_TABLET | ORAL | 0 refills | Status: DC | PRN

## 2018-06-14 MED ORDER — PROMETHAZINE HCL 25 MG PO TABS: 25.0000 mg | ORAL_TABLET | Freq: Three times a day (TID) | ORAL | 0 refills | Status: DC | PRN

## 2018-06-14 NOTE — Addendum Note (Signed)
Addended by: Harriett Sine D on: 06/14/2018 03:31 PM   Modules accepted: Orders

## 2018-06-14 NOTE — Telephone Encounter (Signed)
Pt is unable to get pain medication filled at [facility]. Please send to [facility] [phone].

## 2018-06-14 NOTE — Telephone Encounter (Signed)
Patient called again about her pain medication. Pt would like for pain medication to be sent to another pharmacy. She wants it sent to [facility] on [address] pt had surgery this morning with Dr. March Rummage and shes in pain now. Per pt if she needs to, she can send someone to pick up RX.

## 2018-06-14 NOTE — Telephone Encounter (Signed)
Patient needs pain medication sent to [facility] on [address]. It was sent to another pharmacy this morning and they didn't have the medication.

## 2018-06-14 NOTE — Telephone Encounter (Signed)
I spoke with pt, informed the percocet had not been received electronically at her [facility] and she could pick up a hand written one.

## 2018-06-14 NOTE — Telephone Encounter (Signed)
Ulice Dash, Pharmacist - [facility] states a Percocet rx has not been received at [facility].

## 2018-06-14 NOTE — Progress Notes (Signed)
Rx sent to pharmacy   

## 2018-06-15 NOTE — Telephone Encounter (Signed)
Meds were sent by RN.  FYI They were sent to the pharmacy that the patient's family directed them to.

## 2018-06-16 ENCOUNTER — Ambulatory Visit (INDEPENDENT_AMBULATORY_CARE_PROVIDER_SITE_OTHER): Payer: Self-pay | Admitting: Podiatry

## 2018-06-16 DIAGNOSIS — Z9889 Other specified postprocedural states: Secondary | ICD-10-CM

## 2018-06-16 DIAGNOSIS — M722 Plantar fascial fibromatosis: Secondary | ICD-10-CM

## 2018-06-16 NOTE — Progress Notes (Signed)
DOS 06/14/2018  Excision of plantar fibroma left foot.

## 2018-06-16 NOTE — Progress Notes (Signed)
  Subjective:  Patient ID: Tammie Brown, female    DOB: 1981/03/27,  MRN: [id number]  Chief Complaint  Patient presents with  . Routine Post Op    " my foot feels fine and i have movement and can feel my toes    DOS: 06/14/18 Procedure: Excision plantar fibroma left  37 y.o. female returns for post-op check. Denies N/V/F/Ch. Pain is controlled with current medications.  Objective:   General AA&O x3. Normal mood and affect.  Vascular Foot warm and well perfused.  Neurologic Gross sensation intact.  Dermatologic Skin healing well without signs of infection. Skin edges well coapted without signs of infection.  Orthopedic: Tenderness to palpation noted about the surgical site.    Assessment & Plan:  Patient was evaluated and treated and all questions answered.  S/p excision plantar fibroma left -Progressing as expected post-operatively. -Sutures: Intact. -Medications refilled: None -Foot redressed. -We will follow path report  Return in about 1 week (around 06/23/2018) for Post-op.

## 2018-06-19 ENCOUNTER — Other Ambulatory Visit: Payer: Self-pay | Admitting: Podiatry

## 2018-06-19 MED ORDER — OXYCODONE-ACETAMINOPHEN 10-325 MG PO TABS: 1.0000 | ORAL_TABLET | Freq: Three times a day (TID) | ORAL | 0 refills | Status: DC | PRN

## 2018-06-19 NOTE — Addendum Note (Signed)
Addended by: Harriett Sine D on: 06/19/2018 11:47 AM   Modules accepted: Orders

## 2018-06-19 NOTE — Telephone Encounter (Signed)
Pt. Needs pain medication refilled at the [facility] on [address].

## 2018-06-19 NOTE — Telephone Encounter (Signed)
I informed pt the oxycodone could be picked up in the [place] office.

## 2018-06-23 ENCOUNTER — Ambulatory Visit (INDEPENDENT_AMBULATORY_CARE_PROVIDER_SITE_OTHER): Payer: Medicaid Other | Admitting: Podiatry

## 2018-06-23 ENCOUNTER — Encounter: Payer: Self-pay | Admitting: Podiatry

## 2018-06-23 DIAGNOSIS — M722 Plantar fascial fibromatosis: Secondary | ICD-10-CM

## 2018-06-25 NOTE — Progress Notes (Signed)
  Subjective:  Patient ID: Tammie Brown, female    DOB: 1981/09/19,  MRN: [id number]  Chief Complaint  Patient presents with  . Routine Post [facility] 06.19.2019 Plantar Fibroma Lt " my foot feels better, the incision itches at times"    DOS: 06/14/18 Procedure: Excision plantar fibroma left  37 y.o. female returns for post-op check. Denies N/V/F/Ch.  Reports pain from the incision itching but otherwise pain controlled.  Has been smoking despite being told not to however she states she did mention smoking 1 to 2 cigarettes a day  Objective:   General AA&O x3. Normal mood and affect.  Vascular Foot warm and well perfused.  Neurologic Gross sensation intact.  Dermatologic Skin healing well without signs of infection. Skin edges well coapted without signs of infection.  Orthopedic: Tenderness to palpation noted about the surgical site.    Assessment & Plan:  Patient was evaluated and treated and all questions answered.  S/p excision plantar fibroma left -Progressing as expected post-operatively. -Sutures: Intact. -Medications refilled: None -Foot redressed. -Path not yet available for review -Return in 10 days for suture removal continue nonweightbearing left foot   Return in about 10 days (around 07/03/2018).

## 2018-06-26 ENCOUNTER — Encounter: Payer: Self-pay | Admitting: Podiatry

## 2018-07-03 ENCOUNTER — Ambulatory Visit (INDEPENDENT_AMBULATORY_CARE_PROVIDER_SITE_OTHER): Payer: Medicaid Other | Admitting: Podiatry

## 2018-07-03 ENCOUNTER — Encounter: Payer: Self-pay | Admitting: Podiatry

## 2018-07-03 ENCOUNTER — Telehealth: Payer: Self-pay | Admitting: Podiatry

## 2018-07-03 VITALS — BP 141/92 | HR 80 | Temp 98.7°F | Resp 16

## 2018-07-03 DIAGNOSIS — Z9889 Other specified postprocedural states: Secondary | ICD-10-CM

## 2018-07-03 MED ORDER — OXYCODONE-ACETAMINOPHEN 10-325 MG PO TABS: 1.0000 | ORAL_TABLET | Freq: Three times a day (TID) | ORAL | 0 refills | Status: DC | PRN

## 2018-07-03 NOTE — Addendum Note (Signed)
Addended by: Harriett Sine D on: 07/03/2018 01:52 PM   Modules accepted: Orders

## 2018-07-03 NOTE — Telephone Encounter (Signed)
Dr. Randie Heinz refill of the oxycodone as previously. I informed pt she could pick it up in the [place] office. Pt states understanding.

## 2018-07-03 NOTE — Telephone Encounter (Signed)
Patient just had stitches removed wants to know if she can get pain medication for the pain she's feeling right now. Please call patient back at [phone]

## 2018-07-13 ENCOUNTER — Encounter: Payer: Self-pay | Admitting: Podiatry

## 2018-07-13 ENCOUNTER — Ambulatory Visit (INDEPENDENT_AMBULATORY_CARE_PROVIDER_SITE_OTHER): Payer: Medicaid Other | Admitting: Podiatry

## 2018-07-13 VITALS — BP 112/68 | HR 80 | Temp 97.9°F | Resp 16

## 2018-07-13 DIAGNOSIS — M722 Plantar fascial fibromatosis: Secondary | ICD-10-CM

## 2018-07-13 DIAGNOSIS — Z9889 Other specified postprocedural states: Secondary | ICD-10-CM

## 2018-07-13 MED ORDER — HYDROCODONE-ACETAMINOPHEN 5-325 MG PO TABS: 1.0000 | ORAL_TABLET | Freq: Four times a day (QID) | ORAL | 0 refills | Status: DC | PRN

## 2018-07-13 NOTE — Progress Notes (Signed)
ref

## 2018-07-17 ENCOUNTER — Telehealth: Payer: Self-pay | Admitting: *Deleted

## 2018-07-17 DIAGNOSIS — M722 Plantar fascial fibromatosis: Secondary | ICD-10-CM

## 2018-07-17 DIAGNOSIS — Z9889 Other specified postprocedural states: Secondary | ICD-10-CM

## 2018-07-17 NOTE — Progress Notes (Signed)
  Subjective:  Patient ID: Tammie Brown, female    DOB: [birth year],  MRN: [id number]  Chief Complaint  Patient presents with  . Routine Post Op    dos 06.19.2019 Plantar Fibroma Lt  Pt. stated," my foot burns and itches a lot and the pain is no better." tx: percocet, icing, and elevation -pt deneis N/V/F/Ch    DOS: 06/14/18 Procedure: Excision plantar fibroma left  37 y.o. female returns for post-op check.  States her foot still burns and itches a lot and the pain is not better.  Objective:   General AA&O x3. Normal mood and affect.  Vascular Foot warm and well perfused.  Neurologic Gross sensation intact.  Dermatologic Skin well-healed but with remaining scar tissue  Orthopedic: Tenderness to palpation noted about the surgical site.    Assessment & Plan:  Patient was evaluated and treated and all questions answered.  S/p excision plantar fibroma left -Progressing as expected post-operatively. -Sutures: out. Incision with Scar tissue. -Medications refilled: None -Foot redressed. -Refer to PT; focus on scar tissue massage  Return in about 1 month (around 08/10/2018) for Post-op.

## 2018-07-17 NOTE — Telephone Encounter (Signed)
Referral orders and demographics faxed to [place] [facility].

## 2018-07-18 NOTE — Progress Notes (Signed)
  Subjective:  Patient ID: Tammie Brown, female    DOB: 10-16-1981,  MRN: [id number]  Chief Complaint  Patient presents with  . Routine Post Op     dos 06.19.2019 Plantar Fibroma Lt Pt. stated." there's still pain, burning and itching." Tx: none -pt denies N/v/Ch/F    DOS: 06/14/18 Procedure: Excision plantar fibroma left  37 y.o. female returns for post-op check. Denies N/V/F/Ch.  Still reports pain burning and itching.   Objective:   General AA&O x3. Normal mood and affect.  Vascular Foot warm and well perfused.  Neurologic Gross sensation intact.  Dermatologic Skin healing well without signs of infection. Skin edges well coapted without signs of infection.  Orthopedic: Tenderness to palpation noted about the surgical site.    Assessment & Plan:  Patient was evaluated and treated and all questions answered.  S/p excision plantar fibroma left -Sutures removed.  Steri's applied.  Transition weightbearing.   Return in about 2 weeks (around 07/17/2018) for Post-op.

## 2018-08-03 ENCOUNTER — Telehealth: Payer: Self-pay | Admitting: Podiatry

## 2018-08-03 ENCOUNTER — Ambulatory Visit: Payer: Medicaid Other | Attending: Podiatry | Admitting: Physical Therapy

## 2018-08-03 ENCOUNTER — Encounter: Payer: Self-pay | Admitting: Physical Therapy

## 2018-08-03 DIAGNOSIS — M79672 Pain in left foot: Secondary | ICD-10-CM | POA: Insufficient documentation

## 2018-08-03 DIAGNOSIS — R262 Difficulty in walking, not elsewhere classified: Secondary | ICD-10-CM | POA: Insufficient documentation

## 2018-08-03 MED ORDER — OXYCODONE-ACETAMINOPHEN 10-325 MG PO TABS: 1.0000 | ORAL_TABLET | Freq: Three times a day (TID) | ORAL | 0 refills | Status: DC | PRN

## 2018-08-03 NOTE — Addendum Note (Signed)
Addended by: Harriett Sine D on: 08/03/2018 04:29 PM   Modules accepted: Orders

## 2018-08-03 NOTE — Telephone Encounter (Addendum)
I told pt I would get Dr. March Rummage to call in pain medication. I called pt and informed that Dr. Eleanora Neighbor provider number for Medicaid was not being accepted at this time, but I could get another doctor to sign for it, but she would have to pick up the medication in the [facility]. Pt states understanding.

## 2018-08-03 NOTE — Telephone Encounter (Signed)
Pt. Says it feels like her incision is ripping open on the inside. She needs pain medication. I scheduled patient to come in to be seen 8/9 @ 9:45am. Patient sounds like shes in a lot of pain over the phone.

## 2018-08-03 NOTE — Therapy (Signed)
[place], [place], [address] Phone: [phone]   Fax:  [phone]  Physical Therapy Evaluation  Patient Details  Name: Tammie Brown MRN: [id number] Date of Birth: May 10, 1981 Referring Provider: Hardie Pulley   Encounter Date: 08/03/2018  PT End of Session - 08/03/18 1604    Visit Number  1    Number of Visits  4    Date for PT Re-Evaluation  09/15/18    Authorization Type  Medicaid    PT Start Time  1448    PT Stop Time  1530    PT Time Calculation (min)  42 min    Activity Tolerance  Patient tolerated treatment well    Behavior During Therapy  [facility] for tasks assessed/performed       Past Medical History:  Diagnosis Date  . Chlamydia   . Gonorrhea   . Headache(784.0)   . Heart murmur   . PID (acute pelvic inflammatory disease)   . Urinary tract infection     Past Surgical History:  Procedure Laterality Date  . APPENDECTOMY    . CESAREAN SECTION    . CESAREAN SECTION  2001 & 2009    There were no vitals filed for this visit.   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  Has been in a boot since then, was on crutches with NWB for about 3 weeks.  REports that she is really still hurting and having trouble sleeping and walking    Limitations  Standing;Walking;House hold activities    How long can you walk comfortably?  150 feet    Patient Stated Goals  have less pain ,walk better    Currently in Pain?  Yes    Pain Score  8     Pain Location  Foot    Pain Orientation  Left    Pain Descriptors / Indicators  Sharp;Shooting;Burning    Pain Type  Acute pain;Surgical pain    Pain Onset  More than a month ago    Pain Frequency  Constant    Aggravating Factors   walking, just shoots pain up to 9/10    Pain Relieving Factors  nothing helps, but there are times when she is lying down that she has no  pain    Effect of Pain on Daily Activities  difficulty walking and any ADL's         [facility] PT Assessment - 08/03/18 0001      Assessment   Medical Diagnosis  s/p left foot surgery    Referring Provider  Hardie Pulley    Onset Date/Surgical Date  06/14/18    Prior Therapy  no      Precautions   Precautions  None      Balance Screen   Has the patient had a decrease in activity level because of a fear of falling?   No    Is the patient reluctant to leave their home because of a fear of falling?   No      Home Environment   Additional Comments  no stairs, has to do housework, has a 35 year old and a 35 year old, she is a single parent      Prior Function   Level of Independence  Independent    Vocation  Unemployed    Leisure  no exercise  Observation/Other Assessments-Edema    Edema  --   edema around the mid foot and ankle     ROM / Strength   AROM / PROM / Strength  AROM;Strength      AROM   AROM Assessment Site  Ankle    Right/Left Ankle  Left    Left Ankle Dorsiflexion  0   4 degrees from neutral   Left Ankle Plantar Flexion  20    Left Ankle Inversion  12    Left Ankle Eversion  0      Strength   Overall Strength Comments  very gaurded with motions      Palpation   Palpation comment  Patient with eschar on the bottom of the foot around the scar the wound is about 4cm long there is an area tha the incision is open about .5 cm for a run of 3 cm, medially there is some hardenend skin      Ambulation/Gait   Gait Comments  no device, very antalgic and small steps, with boot on'                Objective measurements completed on examination: See above findings.      [facility] PT Treatment/Exercise - 08/03/18 0001      Modalities   Modalities  Vasopneumatic      Vasopneumatic   Number Minutes Vasopneumatic   10 minutes    Vasopnuematic Location   Ankle    Vasopneumatic Pressure  Medium    Vasopneumatic Temperature   40              PT Education - 08/03/18 1604    Education Details  HEP for ankle pumps, ankle circles toe motions    Person(s) Educated  Patient    Methods  Explanation;Demonstration;Handout    Comprehension  Verbalized understanding       PT Short Term Goals - 08/03/18 1629      PT SHORT TERM GOAL #1   Title  independent with initial HEP    Time  2    Period  Weeks    Status  New        PT Long Term Goals - 08/03/18 1631      PT LONG TERM GOAL #1   Title  decrease pain 50%    Time  12    Period  Weeks    Status  New      PT LONG TERM GOAL #2   Title  increase active DF to 15 degrees    Time  12    Period  Weeks    Status  New      PT LONG TERM GOAL #3   Title  walk wihtout boot and without deviation x 500 feet    Time  12    Period  Weeks    Status  New      PT LONG TERM GOAL #4   Title  have strength of the ankle a 4+/5    Time  12    Period  Weeks    Status  New             Plan - 08/03/18 1605    Clinical Impression Statement  Patient with a surgical excision of a plantar fascial fibromatosis on 06/14/18.  She was non weight bearing for 3 weeks, she has been in a boot since the surgery, she reports hihg rating of pain.  Reports shooting pain at times and can  be when she is sitting, very limited ROM, hs the scar that is open with some tough blackened skin on the medial side of the scar.  She is very tender here    Clinical Presentation  Stable    Clinical Decision Making  Low    Rehab Potential  Good    PT Frequency  1x / week    PT Duration  12 weeks    PT Treatment/Interventions  ADLs/Self Care Home Management;Cryotherapy;Electrical Stimulation;Moist Heat;Gait training;Stair training;Functional mobility training;Therapeutic activities;Therapeutic exercise;Balance training;Neuromuscular re-education;Manual techniques;Vasopneumatic Device;Patient/family education    PT Next Visit Plan  start exercises, be aware of the scar is not closed, she reports  tha she will see MD next week    Consulted and Agree with Plan of Care  Patient       Patient will benefit from skilled therapeutic intervention in order to improve the following deficits and impairments:  Abnormal gait, Decreased range of motion, Difficulty walking, Increased muscle spasms, Pain, Impaired flexibility, Decreased scar mobility, Decreased balance, Decreased mobility, Decreased strength  Visit Diagnosis: Pain in left foot - Plan: PT plan of care cert/re-cert  Difficulty in walking, not elsewhere classified - Plan: PT plan of care cert/re-cert     Problem List Patient Active Problem List   Diagnosis Date Noted  . Smoker 04/08/2014  . Contraception management 04/08/2014  . Gonorrhea 08/17/2012  . Allergy to cephalosporin 08/17/2012    Charlotte Brafford W.,PT 08/03/2018, 5:00 PM  [address] [place], [place], [address] Phone: [phone]   Fax:  [phone]  Name: Tammie Brown MRN: [id number] Date of Birth: November 29, 1981

## 2018-08-04 ENCOUNTER — Encounter: Payer: Medicaid Other | Admitting: Podiatry

## 2018-08-10 ENCOUNTER — Encounter: Payer: Medicaid Other | Admitting: Podiatry

## 2018-08-11 ENCOUNTER — Ambulatory Visit (INDEPENDENT_AMBULATORY_CARE_PROVIDER_SITE_OTHER): Payer: Medicaid Other | Admitting: Podiatry

## 2018-08-11 DIAGNOSIS — Z9889 Other specified postprocedural states: Secondary | ICD-10-CM

## 2018-08-13 NOTE — Progress Notes (Signed)
  Subjective:  Patient ID: Tammie Brown, female    DOB: 02/10/1981,  MRN: [id number]  Chief Complaint  Patient presents with  . Routine [facility] 06.19.2019 Plantar Fibroma Lt     DOS: 06/14/18 Procedure: Excision plantar fibroma left  37 y.o. female returns for post-op check.  Still having pain at the incision site.  Objective:   General AA&O x3. Normal mood and affect.  Vascular Foot warm and well perfused.  Neurologic Gross sensation intact.  Dermatologic Skin well-healed but with remaining scar tissue  Orthopedic: Tenderness to palpation noted about the surgical site.   Assessment & Plan:  Patient was evaluated and treated and all questions answered.  S/p excision plantar fibroma left -Skin with slight scar tissue. Discussed continued massage. -Continue PT. -Medications refilled: None  Return if symptoms worsen or fail to improve.

## 2018-08-14 ENCOUNTER — Ambulatory Visit: Payer: Medicaid Other | Admitting: Physical Therapy

## 2018-08-15 ENCOUNTER — Encounter: Payer: Self-pay | Admitting: Physical Therapy

## 2018-08-15 ENCOUNTER — Telehealth: Payer: Self-pay | Admitting: Podiatry

## 2018-08-15 ENCOUNTER — Ambulatory Visit: Payer: Medicaid Other | Admitting: Physical Therapy

## 2018-08-15 DIAGNOSIS — R262 Difficulty in walking, not elsewhere classified: Secondary | ICD-10-CM

## 2018-08-15 DIAGNOSIS — M79672 Pain in left foot: Secondary | ICD-10-CM | POA: Diagnosis not present

## 2018-08-15 MED ORDER — OXYCODONE-ACETAMINOPHEN 10-325 MG PO TABS: 1.0000 | ORAL_TABLET | Freq: Three times a day (TID) | ORAL | 0 refills | Status: DC | PRN

## 2018-08-15 NOTE — Therapy (Signed)
[place], [place], [address] Phone: [phone]   Fax:  [phone]  Physical Therapy Treatment  Patient Details  Name: Tammie Brown MRN: [id number] Date of Birth: 1981-11-17 Referring Provider: Hardie Pulley   Encounter Date: 08/15/2018  PT End of Session - 08/15/18 1219    Visit Number  2    Number of Visits  4    Date for PT Re-Evaluation  09/15/18    Authorization Type  Medicaid    PT Start Time  5852    PT Stop Time  1233    PT Time Calculation (min)  48 min    Activity Tolerance  Patient limited by pain    Behavior During Therapy  [facility] for tasks assessed/performed       Past Medical History:  Diagnosis Date  . Chlamydia   . Gonorrhea   . Headache(784.0)   . Heart murmur   . PID (acute pelvic inflammatory disease)   . Urinary tract infection     Past Surgical History:  Procedure Laterality Date  . APPENDECTOMY    . CESAREAN SECTION    . CESAREAN SECTION  2001 & 2009    There were no vitals filed for this visit.  Subjective Assessment - 08/15/18 1150    Subjective  "My foot hurt and my leg hurt but I will be all right"    Currently in Pain?  Yes    Pain Score  7     Pain Location  Foot    Pain Orientation  Left                       [facility] Adult PT Treatment/Exercise - 08/15/18 0001      Exercises   Exercises  Ankle      Manual Therapy   Manual Therapy  Passive ROM    Passive ROM  L ankle all directions.      Ankle Exercises: Aerobic   Nustep  L2 x4 min       Ankle Exercises: Seated   ABC's  2 reps    Ankle Circles/Pumps  20 reps;Left    Towel Crunch  5 reps    Other Seated Ankle Exercises  L ankle PF/DF, EV/IV, circles on dyna disk x20                PT Short Term Goals - 08/03/18 1629      PT SHORT TERM GOAL #1   Title  independent with initial HEP    Time  2    Period  Weeks    Status  New        PT Long Term Goals -  08/03/18 1631      PT LONG TERM GOAL #1   Title  decrease pain 50%    Time  12    Period  Weeks    Status  New      PT LONG TERM GOAL #2   Title  increase active DF to 15 degrees    Time  12    Period  Weeks    Status  New      PT LONG TERM GOAL #3   Title  walk wihtout boot and without deviation x 500 feet    Time  12    Period  Weeks    Status  New      PT LONG TERM GOAL #4  Title  have strength of the ankle a 4+/5    Time  12    Period  Weeks    Status  New            Plan - 08/15/18 1222    Clinical Impression Statement  Pt reports retuning from MD and him being pleased. Pt scar is healing, she has a decompression bandage on it to relive pressure so she can walk. Antalgic gait but to pain. She was able to perform all of today's activities but reports pain throughout treatment. PROM was good.    Rehab Potential  Good    PT Frequency  1x / week    PT Duration  12 weeks    PT Treatment/Interventions  ADLs/Self Care Home Management;Cryotherapy;Electrical Stimulation;Moist Heat;Gait training;Stair training;Functional mobility training;Therapeutic activities;Therapeutic exercise;Balance training;Neuromuscular re-education;Manual techniques;Vasopneumatic Device;Patient/family education    PT Next Visit Plan  start exercises, be aware of the scar is not closed,        Patient will benefit from skilled therapeutic intervention in order to improve the following deficits and impairments:  Abnormal gait, Decreased range of motion, Difficulty walking, Increased muscle spasms, Pain, Impaired flexibility, Decreased scar mobility, Decreased balance, Decreased mobility, Decreased strength  Visit Diagnosis: Pain in left foot  Difficulty in walking, not elsewhere classified     Problem List Patient Active Problem List   Diagnosis Date Noted  . Smoker 04/08/2014  . Contraception management 04/08/2014  . Gonorrhea 08/17/2012  . Allergy to cephalosporin 08/17/2012     Scot Jun, PTA 08/15/2018, 12:25 PM  [place], [place], [address] Phone: [phone]   Fax:  [phone]  Name: KHLOEY CHERN MRN: [id number] Date of Birth: Oct 08, 1981

## 2018-08-15 NOTE — Telephone Encounter (Signed)
Pt. Needs refill on pain med.

## 2018-08-15 NOTE — Addendum Note (Signed)
Addended by: Harriett Sine D on: 08/15/2018 04:35 PM   Modules accepted: Orders

## 2018-08-15 NOTE — Telephone Encounter (Signed)
Pt is to pick up the percocet rx in the [place] office.

## 2018-08-15 NOTE — Telephone Encounter (Signed)
Patient would like a refill on her pain medication.

## 2018-08-16 NOTE — Telephone Encounter (Signed)
error 

## 2018-08-18 ENCOUNTER — Ambulatory Visit: Payer: Medicaid Other | Admitting: Podiatry

## 2018-08-23 ENCOUNTER — Encounter: Payer: Self-pay | Admitting: Physical Therapy

## 2018-08-23 ENCOUNTER — Ambulatory Visit: Payer: Medicaid Other | Admitting: Physical Therapy

## 2018-08-23 DIAGNOSIS — R262 Difficulty in walking, not elsewhere classified: Secondary | ICD-10-CM

## 2018-08-23 DIAGNOSIS — M79672 Pain in left foot: Secondary | ICD-10-CM | POA: Diagnosis not present

## 2018-08-23 NOTE — Therapy (Addendum)
[place], [place], [address] Phone: [phone]   Fax:  [phone]  Physical Therapy Treatment  Patient Details  Name: Tammie Brown MRN: [id number] Date of Birth: [birth year] Referring Provider: Hardie Pulley   Encounter Date: 08/23/2018  PT End of Session - 08/23/18 1556    Visit Number  4    Date for PT Re-Evaluation  09/15/18    Authorization Type  Medicaid    PT Start Time  1520    PT Stop Time  1555    PT Time Calculation (min)  35 min    Activity Tolerance  Patient limited by pain    Behavior During Therapy  [facility] for tasks assessed/performed       Past Medical History:  Diagnosis Date  . Chlamydia   . Gonorrhea   . Headache(784.0)   . Heart murmur   . PID (acute pelvic inflammatory disease)   . Urinary tract infection     Past Surgical History:  Procedure Laterality Date  . APPENDECTOMY    . CESAREAN SECTION    . CESAREAN SECTION  2001 & 2009    There were no vitals filed for this visit.  Subjective Assessment - 08/23/18 1522    Subjective  "It's all right"     Currently in Pain?  Yes    Pain Score  6     Pain Location  Foot    Pain Orientation  Left                       [facility] Adult PT Treatment/Exercise - 08/23/18 0001      Manual Therapy   Manual Therapy  Passive ROM    Passive ROM  L ankle all directions.      Ankle Exercises: Aerobic   Nustep  L2 x4 min       Ankle Exercises: Seated   Ankle Circles/Pumps  20 reps;Left    Other Seated Ankle Exercises  4 way red tband x15 each    Other Seated Ankle Exercises  L ankle PF/DF, EV/IV, circles on dyna disk x20                PT Short Term Goals - 08/03/18 1629      PT SHORT TERM GOAL #1   Title  independent with initial HEP    Time  2    Period  Weeks    Status  New        PT Long Term Goals - 08/03/18 1631      PT LONG TERM GOAL #1   Title  decrease pain 50%    Time  12     Period  Weeks    Status  New      PT LONG TERM GOAL #2   Title  increase active DF to 15 degrees    Time  12    Period  Weeks    Status  New      PT LONG TERM GOAL #3   Title  walk wihtout boot and without deviation x 500 feet    Time  12    Period  Weeks    Status  New      PT LONG TERM GOAL #4   Title  have strength of the ankle a 4+/5    Time  12    Period  Weeks    Status  New  Plan - 08/23/18 1557    Clinical Impression Statement  Pt ~ 9 minutes late for today's treatment. pt ambulated with an antalgic gait, decrease stance time on LLE. Pt reports pain with all ankle motions, more so with plantar flexion. weakness noted with inversion with 4 way ankle exercise. Denied modality post treatment    Rehab Potential  Good    PT Frequency  1x / week    PT Duration  12 weeks    PT Treatment/Interventions  ADLs/Self Care Home Management;Cryotherapy;Electrical Stimulation;Moist Heat;Gait training;Stair training;Functional mobility training;Therapeutic activities;Therapeutic exercise;Balance training;Neuromuscular re-education;Manual techniques;Vasopneumatic Device;Patient/family education    PT Next Visit Plan  L ankle ROM and stability, be aware of the scar is not closed,        Patient will benefit from skilled therapeutic intervention in order to improve the following deficits and impairments:  Abnormal gait, Decreased range of motion, Difficulty walking, Increased muscle spasms, Pain, Impaired flexibility, Decreased scar mobility, Decreased balance, Decreased mobility, Decreased strength  Visit Diagnosis: Pain in left foot  Difficulty in walking, not elsewhere classified     Problem List Patient Active Problem List   Diagnosis Date Noted  . Smoker 04/08/2014  . Contraception management 04/08/2014  . Gonorrhea 08/17/2012  . Allergy to cephalosporin 08/17/2012   PHYSICAL THERAPY DISCHARGE SUMMARY  Visits from Start of Care: 4  Plan: Patient  agrees to discharge.  Patient goals were not met. Patient is being discharged due to not returning since the last visit.  ?????      Scot Jun, PTA 08/23/2018, 4:04 PM  [place] [address] [place] [address] [place], [place], [address] Phone: [phone]   Fax:  [phone]  Name: Tammie Brown MRN: [id number] Date of Birth: 1981-04-28

## 2018-08-24 ENCOUNTER — Encounter: Payer: Self-pay | Admitting: Podiatry

## 2018-08-24 ENCOUNTER — Ambulatory Visit (INDEPENDENT_AMBULATORY_CARE_PROVIDER_SITE_OTHER): Payer: Self-pay | Admitting: Podiatry

## 2018-08-24 DIAGNOSIS — Z9889 Other specified postprocedural states: Secondary | ICD-10-CM

## 2018-08-24 NOTE — Progress Notes (Signed)
  Subjective:  Patient ID: Tammie Brown, female    DOB: 06/20/1981,  MRN: [id number]  Chief Complaint  Patient presents with  . Routine Post Op    left foot bottom; pt stated, "getting painful since started physical therapy"    DOS: 06/14/18 Procedure: Excision plantar fibroma left  37 y.o. female returns for post-op check.  Having more pain in the foot since she started therapy, though she admits overall her therapy is helping.  Objective:   General AA&O x3. Normal mood and affect.  Vascular Foot warm and well perfused.  Neurologic Gross sensation intact.  Dermatologic Skin well-healed but with remaining scar tissue and slight hyperkeratosis.  Orthopedic: Tenderness to palpation noted about the surgical site.   Assessment & Plan:  Patient was evaluated and treated and all questions answered.  S/p excision plantar fibroma left -Skin with slight scar tissue and hyperkeratosis. Urea cream dispensed. Educated on application. -Continue PT. -Medications refilled: None  Return if symptoms worsen or fail to improve.

## 2018-08-29 ENCOUNTER — Encounter: Payer: Self-pay | Admitting: *Deleted

## 2018-08-29 ENCOUNTER — Telehealth: Payer: Self-pay | Admitting: Podiatry

## 2018-08-29 MED ORDER — TRAMADOL HCL 50 MG PO TABS: 50.0000 mg | ORAL_TABLET | Freq: Three times a day (TID) | ORAL | 0 refills | Status: DC | PRN

## 2018-08-29 NOTE — Telephone Encounter (Signed)
She can return to work but I can only give her Tramadol at this time.

## 2018-08-29 NOTE — Addendum Note (Signed)
Addended by: Harriett Sine D on: 08/29/2018 02:20 PM   Modules accepted: Orders

## 2018-08-29 NOTE — Telephone Encounter (Signed)
I informed pt of Dr. Eleanora Neighbor orders and she states she will come by and pick up the prescription and the note, she would like to return to work as soon as possible.

## 2018-08-29 NOTE — Telephone Encounter (Signed)
Needs refill on pain med. Patient wants to know when she can return to work.

## 2018-08-30 ENCOUNTER — Telehealth: Payer: Self-pay | Admitting: Podiatry

## 2018-08-30 NOTE — Telephone Encounter (Signed)
Pharmacist states #28 can be prescribed without the need of a PA for the Tramadol. I told the pharmacist to fill for #28 and I would inform pt.

## 2018-08-30 NOTE — Telephone Encounter (Signed)
I informed pt of the change to #28 Tramadol so she could get the medication without a PA. Pt states agreement.

## 2018-08-30 NOTE — Telephone Encounter (Signed)
Pt needs medication refill, but Pharmacy said they need authorization first.

## 2018-09-08 NOTE — Progress Notes (Signed)
Erroneous encounter, please disregard

## 2018-11-02 ENCOUNTER — Emergency Department (HOSPITAL_COMMUNITY): Payer: Medicaid Other

## 2018-11-02 ENCOUNTER — Emergency Department (HOSPITAL_COMMUNITY)
Admission: EM | Admit: 2018-11-02 | Discharge: 2018-11-02 | Disposition: A | Discharge status: Home / Self Care | Payer: Medicaid Other | Attending: Emergency Medicine | Admitting: Emergency Medicine

## 2018-11-02 ENCOUNTER — Encounter (HOSPITAL_COMMUNITY): Payer: Self-pay | Admitting: *Deleted

## 2018-11-02 DIAGNOSIS — Z79899 Other long term (current) drug therapy: Secondary | ICD-10-CM | POA: Insufficient documentation

## 2018-11-02 DIAGNOSIS — M549 Dorsalgia, unspecified: Secondary | ICD-10-CM | POA: Diagnosis not present

## 2018-11-02 DIAGNOSIS — F17210 Nicotine dependence, cigarettes, uncomplicated: Secondary | ICD-10-CM | POA: Insufficient documentation

## 2018-11-02 LAB — POC URINE PREG, ED: Preg Test, Ur: NEGATIVE

## 2018-11-02 MED ORDER — METHOCARBAMOL 500 MG PO TABS: 500.0000 mg | ORAL_TABLET | Freq: Every evening | ORAL | 0 refills | Status: DC | PRN

## 2018-11-02 MED ORDER — KETOROLAC TROMETHAMINE 15 MG/ML IJ SOLN
15.0000 mg | Freq: Once | INTRAMUSCULAR | Status: AC
  Administered 2018-11-02: 15 mg via INTRAMUSCULAR
  Filled 2018-11-02: qty 1

## 2018-11-02 NOTE — ED Triage Notes (Signed)
Pt fell on a wooden post onto her back 2 weeks ago. Pt has been taking ibuprofen w/o relief. Pt states the pain is worse when taking deep breaths and moving.

## 2018-11-02 NOTE — Discharge Instructions (Addendum)
Continue taking ibuprofen with meals.  Do not take other anti-inflammatories at the same time (Advil, Motrin, naproxen, Aleve). You may supplement with Tylenol if you need further pain control. Use Robaxin as needed for muscle stiffness or soreness. Have caution, as this may make you tired or groggy. Do not drive or operate heavy machinery while taking this medication.  Use muscle creams (bengay, icy hot, salonpas) as needed for pain.  Use health to help relax muscles.  Do the back stretches 2 times a day. It is important that you continue to move to decrease stiffness.  Follow up with [facility], [facility], [facility] if this treatment is not working in 2 weeks, or to establish primary care.  Return to the ER if you develop high fevers, numbness, loss of bowel or bladder control, or any new or concerning symptoms.

## 2018-11-02 NOTE — ED Provider Notes (Signed)
[facility] DEPT Provider Note   CSN: [id number] Arrival date & time: 11/02/18  1326     History   Chief Complaint Chief Complaint  Patient presents with  . Back Pain    HPI Tammie Brown is a 37 y.o. female presenting for evaluation of back pain.  Patient states 2 weeks ago she fell backwards, hitting her back against her sister's bedpost.  She had acute onset back pain.  Swelling improved over the first couple days, but pain has persisted for the past 2 weeks.  Pain is worse with movement, deep breaths, coughing, and palpation.  Nothing makes it better.  Patient is taking 600 mg of ibuprofen every 4-6 hours without improvement of symptoms.  She has not tried anything else.  She denies hitting her head or loss of consciousness.  She denies numbness or tingling.  She denies loss of bowel bladder control.  Pain is radiating from her mid back down to her lower back now.  Patient reports cough at night.  Denies fevers.  Denies anterior chest pain. Pt states she does not have a pcp.   HPI  Past Medical History:  Diagnosis Date  . Chlamydia   . Gonorrhea   . Headache(784.0)   . Heart murmur   . PID (acute pelvic inflammatory disease)   . Urinary tract infection     Patient Active Problem List   Diagnosis Date Noted  . Smoker 04/08/2014  . Contraception management 04/08/2014  . Gonorrhea 08/17/2012  . 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 APPENDECTOMY    . CESAREAN SECTION    . CESAREAN SECTION  2001 & 2009     OB History    Gravida  2   Para  2   Term  2   Preterm      AB      Living  2     SAB      TAB      Ectopic      Multiple      Live Births  1            Home Medications    Prior to Admission medications   Medication Sig Start Date End Date Taking? Authorizing Provider  acetaminophen (TYLENOL) 500 MG tablet Take 1 tablet (500 mg total) by mouth every  6 (six) hours as needed. 05/11/18   Law, Bea Graff, PA-C  clindamycin (CLEOCIN) 150 MG capsule Take 1 capsule (150 mg total) by mouth 2 (two) times daily. 06/14/18   Evelina Bucy, DPM  HYDROcodone-acetaminophen (NORCO) 5-325 MG tablet Take 1 tablet by mouth every 6 (six) hours as needed for moderate pain. 07/13/18   Evelina Bucy, DPM  ibuprofen (ADVIL,MOTRIN) 600 MG tablet Take 1 tablet (600 mg total) by mouth every 6 (six) hours as needed. 05/11/18   Law, Bea Graff, PA-C  Menthol (HALLS COUGH DROPS MT) Use as directed 1 tablet in the mouth or throat every 6 (six) hours as needed (sore throat).    [provider]  methocarbamol (ROBAXIN) 500 MG tablet Take 1 tablet (500 mg total) by mouth at bedtime as needed for muscle spasms. 11/02/18   Suprina Mandeville, PA-C  oxyCODONE-acetaminophen (PERCOCET) 10-325 MG tablet Take 1 tablet by mouth every 4 (four) hours as needed for pain. 06/14/18   Evelina Bucy, DPM  oxyCODONE-acetaminophen (PERCOCET) 10-325 MG tablet Take 1 tablet by mouth every 8 (eight)  hours as needed for pain. 08/15/18   Wallene Huh, DPM  promethazine (PHENERGAN) 25 MG tablet Take 1 tablet (25 mg total) by mouth every 8 (eight) hours as needed for nausea or vomiting. 06/14/18   Evelina Bucy, DPM  traMADol (ULTRAM) 50 MG tablet Take 1 tablet (50 mg total) by mouth every 8 (eight) hours as needed. 08/29/18   Evelina Bucy, DPM    Family History Family History  Problem Relation Age of Onset  . Asthma Mother   . Hypertension Mother   . Diabetes Father   . Cancer Maternal Grandfather        throat  . Other Neg Hx   . Hearing loss Neg Hx     Social History Social History   Tobacco Use  . Smoking status: Current Some Day Smoker    Packs/day: 0.50    Years: 15.00    Pack years: 7.50    Types: Cigarettes  . Smokeless tobacco: Never Used  Substance Use Topics  . Alcohol use: Yes    Comment: occassional few times a month  LAST DRANK- FRIDAY-  BEER AND  LIQUOR  . Drug use: Yes    Frequency: 2.0 times per week    Types: Marijuana    Comment: LAST SMOKED - MONDAY     Allergies   Penicillins and Tuberculin tests   Review of Systems Review of Systems  Constitutional: Negative for fever.  Respiratory: Positive for cough. Negative for shortness of breath.   Cardiovascular: Negative for chest pain.  Gastrointestinal: Negative for nausea and vomiting.  Genitourinary:       No loss of bowel or bladder control  Musculoskeletal: Positive for back pain. Negative for neck pain.  Skin: Negative for wound.  Allergic/Immunologic: Negative for immunocompromised state.  Neurological: Negative for numbness and headaches.  Hematological: Does not bruise/bleed easily.  Psychiatric/Behavioral: Negative for confusion.     Physical Exam Updated Vital Signs BP 117/64 (BP Location: Left Arm)   Pulse 66   Temp 98.5 F (36.9 C) (Oral)   Resp 16   Ht 5\' 5"  (1.651 m)   Wt 84.4 kg   LMP 10/02/2018   SpO2 100%   BMI 30.95 kg/m   Physical Exam  Constitutional: She is oriented to person, place, and time. She appears well-developed and well-nourished. No distress.  Sitting in the chair in NAD  HENT:  Head: Normocephalic and atraumatic.  Eyes: Pupils are equal, round, and reactive to light. Conjunctivae and EOM are normal.  Neck: Normal range of motion. Neck supple.  Cardiovascular: Normal rate, regular rhythm and intact distal pulses.  Pulmonary/Chest: Effort normal and breath sounds normal. No respiratory distress. She has no wheezes. She exhibits no tenderness.  Speaking in full sentences. Pain with deep breaths. Clear lung sounds in all fields.   Abdominal: Soft. She exhibits no distension and no mass. There is no tenderness. There is no guarding.  Musculoskeletal: Normal range of motion. She exhibits tenderness.  TTP of L upper back and thoracic and lumbar spine. No ttp of R side back. No step offs or deformities. Pt is ambulatory. Strength  intact x4, sensation intact x4. No saddle paresthesias.   Neurological: She is alert and oriented to person, place, and time. No sensory deficit.  Skin: Skin is warm and dry.  Psychiatric: She has a normal mood and affect.  Nursing note and vitals reviewed.    ED Treatments / Results  Labs (all labs ordered are listed, but only  abnormal results are displayed) Labs Reviewed  POC URINE PREG, ED    EKG None  Radiology Dg Chest 2 View  Result Date: 11/02/2018 CLINICAL DATA:  Back pain after fall 2 weeks ago. EXAM: CHEST - 2 VIEW COMPARISON:  Radiographs of September 01, 2017. FINDINGS: The heart size and mediastinal contours are within normal limits. Both lungs are clear. No pneumothorax or pleural effusion is noted. The visualized skeletal structures are unremarkable. IMPRESSION: No active cardiopulmonary disease. Electronically Signed   By: Marijo Conception, M.D.   On: 11/02/2018 15:23   Dg Lumbar Spine Complete  Result Date: 11/02/2018 CLINICAL DATA:  Low back pain after fall 2 weeks ago. EXAM: LUMBAR SPINE - COMPLETE 4+ VIEW COMPARISON:  None. FINDINGS: There is no evidence of lumbar spine fracture. Alignment is normal. Intervertebral disc spaces are maintained. IMPRESSION: Negative. Electronically Signed   By: Marijo Conception, M.D.   On: 11/02/2018 15:23    Procedures Procedures (including critical care time)  Medications Ordered in ED Medications  ketorolac (TORADOL) 15 MG/ML injection 15 mg (15 mg Intramuscular Given 11/02/18 1449)     Initial Impression / Assessment and Plan / ED Course  I have reviewed the triage vital signs and the nursing notes.  Pertinent labs & imaging results that were available during my care of the patient were reviewed by me and considered in my medical decision making (see chart for details).     Patient presenting for evaluation of back pain after a fall 2 weeks ago.  Physical exam reassuring, no obvious neurologic deficits.  She is afebrile  not tachycardic.  No cough during the daytime and no fevers.  Low suspicion for pneumonia, however as patient reports continued pain after 2 wks, will obtain chest x-ray to rule out pneumonia, fracture, pneumothorax, or vertebral issue.  Low suspicion for PE, symptoms began after trauma and pain with deep breaths is likely muscle related, not pleuritic pain.  We will also obtain lumbar spine x-rays.  If xrays are negative, consider possible need for CT to rule out occult fracture.  Toradol for pain.  X-rays viewed and interpreted by me, no fractures or dislocations.  No pneumonia or pneumothorax.  Discussed findings with patient.  Discussed option of CT to rule out occult fracture, patient declines.  Discussed treatment with NSAIDs, muscle relaxers, muscle creams, heat/ice, and stretching.  Discussed follow-up with [facility] and wellness as needed.  At this time, patient appears safe for discharge.  Return percussions given.  Patient states she understands and agrees plan.   Final Clinical Impressions(s) / ED Diagnoses   Final diagnoses:  Acute left-sided back pain, unspecified back location    ED Discharge Orders         Ordered    methocarbamol (ROBAXIN) 500 MG tablet  At bedtime PRN     11/02/18 1548           Bobbi Yount, PA-C 11/02/18 1713    Davonna Belling, MD 11/03/18 2332

## 2018-12-14 ENCOUNTER — Encounter (HOSPITAL_COMMUNITY): Payer: Self-pay

## 2018-12-14 ENCOUNTER — Emergency Department (HOSPITAL_COMMUNITY)
Admission: EM | Admit: 2018-12-14 | Discharge: 2018-12-14 | Disposition: A | Discharge status: Home / Self Care | Payer: Medicaid Other | Attending: Emergency Medicine | Admitting: Emergency Medicine

## 2018-12-14 ENCOUNTER — Other Ambulatory Visit: Payer: Self-pay

## 2018-12-14 ENCOUNTER — Emergency Department (HOSPITAL_COMMUNITY): Payer: Medicaid Other

## 2018-12-14 DIAGNOSIS — J45909 Unspecified asthma, uncomplicated: Secondary | ICD-10-CM | POA: Diagnosis not present

## 2018-12-14 DIAGNOSIS — F17210 Nicotine dependence, cigarettes, uncomplicated: Secondary | ICD-10-CM | POA: Insufficient documentation

## 2018-12-14 DIAGNOSIS — J40 Bronchitis, not specified as acute or chronic: Secondary | ICD-10-CM

## 2018-12-14 DIAGNOSIS — R05 Cough: Secondary | ICD-10-CM | POA: Diagnosis present

## 2018-12-14 LAB — COMPREHENSIVE METABOLIC PANEL
ALT: 19 U/L (ref 0–44)
AST: 25 U/L (ref 15–41)
Albumin: 3.7 g/dL (ref 3.5–5.0)
Alkaline Phosphatase: 48 U/L (ref 38–126)
Anion gap: 8 (ref 5–15)
BUN: 6 mg/dL (ref 6–20)
CO2: 24 mmol/L (ref 22–32)
Calcium: 9 mg/dL (ref 8.9–10.3)
Chloride: 104 mmol/L (ref 98–111)
Creatinine, Ser: 0.76 mg/dL (ref 0.44–1.00)
GFR calc Af Amer: >60 mL/min (ref >60)
GFR calc non Af Amer: >60 mL/min (ref >60)
Glucose, Bld: 92 mg/dL (ref 70–99)
POTASSIUM: 3.6 mmol/L (ref 3.5–5.1)
Sodium: 136 mmol/L (ref 135–145)
Total Bilirubin: 0.6 mg/dL (ref 0.3–1.2)
Total Protein: 7.2 g/dL (ref 6.5–8.1)

## 2018-12-14 LAB — CBC WITH DIFFERENTIAL/PLATELET
Abs Immature Granulocytes: 0.03 10*3/uL (ref 0.00–0.07)
BASOS PCT: 0 %
Basophils Absolute: 0 10*3/uL (ref 0.0–0.1)
EOS ABS: 0 10*3/uL (ref 0.0–0.5)
Eosinophils Relative: 1 %
HCT: 32.7 % — ABNORMAL LOW (ref 36.0–46.0)
Hemoglobin: 9.5 g/dL — ABNORMAL LOW (ref 12.0–15.0)
Immature Granulocytes: 0 %
LYMPHS ABS: 0.9 10*3/uL (ref 0.7–4.0)
Lymphocytes Relative: 11 %
MCH: 22.2 pg — ABNORMAL LOW (ref 26.0–34.0)
MCHC: 29.1 g/dL — ABNORMAL LOW (ref 30.0–36.0)
MCV: 76.4 fL — ABNORMAL LOW (ref 80.0–100.0)
Monocytes Absolute: 0.4 10*3/uL (ref 0.1–1.0)
Monocytes Relative: 5 %
Neutro Abs: 7 10*3/uL (ref 1.7–7.7)
Neutrophils Relative %: 83 %
Platelets: 285 10*3/uL (ref 150–400)
RBC: 4.28 MIL/uL (ref 3.87–5.11)
RDW: 18.9 % — AB (ref 11.5–15.5)
WBC: 8.5 10*3/uL (ref 4.0–10.5)
nRBC: 0 % (ref 0.0–0.2)

## 2018-12-14 LAB — I-STAT BETA HCG BLOOD, ED (MC, WL, AP ONLY): I-stat hCG, quantitative: <5 m[IU]/mL (ref <5)

## 2018-12-14 MED ORDER — DOXYCYCLINE HYCLATE 100 MG PO TABS
100.0000 mg | ORAL_TABLET | Freq: Once | ORAL | Status: AC
  Administered 2018-12-14: 100 mg via ORAL
  Filled 2018-12-14: qty 1

## 2018-12-14 MED ORDER — BENZONATATE 100 MG PO CAPS: 100.0000 mg | ORAL_CAPSULE | Freq: Three times a day (TID) | ORAL | 0 refills | Status: DC

## 2018-12-14 MED ORDER — SODIUM CHLORIDE 0.9 % IV BOLUS
1000.0000 mL | Freq: Once | INTRAVENOUS | Status: AC
  Administered 2018-12-14: 1000 mL via INTRAVENOUS

## 2018-12-14 MED ORDER — KETOROLAC TROMETHAMINE 30 MG/ML IJ SOLN
30.0000 mg | Freq: Once | INTRAMUSCULAR | Status: AC
  Administered 2018-12-14: 30 mg via INTRAVENOUS
  Filled 2018-12-14: qty 1

## 2018-12-14 MED ORDER — DOXYCYCLINE HYCLATE 100 MG PO CAPS: 100.0000 mg | ORAL_CAPSULE | Freq: Two times a day (BID) | ORAL | 0 refills | Status: DC

## 2018-12-14 NOTE — ED Triage Notes (Signed)
Patient c/o a productive cough with green sputum x 6 days. Patient c/o chest pain from coughing. Patient states she has been taking OTC meds with no relief.

## 2018-12-14 NOTE — ED Provider Notes (Signed)
[facility] DEPT Provider Note   CSN: [id number] Arrival date & time: 12/14/18  0910     History   Chief Complaint Chief Complaint  Patient presents with  . Cough    HPI Tammie Brown is a 37 y.o. female otherwise healthy here presenting with cough, chest pain.  Patient has been having productive cough with greenish sputum for the last 6 days.  Patient has some chills but did not take her temperature.  She states that she has chest pain when she coughs.  Is been trying over-the-counter Mucinex as well as TheraFlu with no relief.  States that her child was sick with similar symptoms and was given a course of antibiotics for presumed pneumonia.   The history is provided by the patient.    Past Medical History:  Diagnosis Date  . Chlamydia   . Gonorrhea   . Headache(784.0)   . Heart murmur   . PID (acute pelvic inflammatory disease)   . Urinary tract infection     Patient Active Problem List   Diagnosis Date Noted  . Smoker 04/08/2014  . Contraception management 04/08/2014  . Gonorrhea 08/17/2012  . 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 APPENDECTOMY    . CESAREAN SECTION    . CESAREAN SECTION  2001 & 2009  . FOOT SURGERY Left      OB History    Gravida  2   Para  2   Term  2   Preterm      AB      Living  2     SAB      TAB      Ectopic      Multiple      Live Births  1            Home Medications    Prior to Admission medications   Medication Sig Start Date End Date Taking? Authorizing Provider  guaiFENesin (MUCINEX) 600 MG 12 hr tablet Take 600 mg by mouth 2 (two) times daily.   Yes [provider]  HYDROcodone-acetaminophen (NORCO) 5-325 MG tablet Take 1 tablet by mouth every 6 (six) hours as needed for moderate pain. Patient not taking: Reported on 12/14/2018 07/13/18   Evelina Bucy, DPM  methocarbamol (ROBAXIN) 500 MG tablet Take 1  tablet (500 mg total) by mouth at bedtime as needed for muscle spasms. Patient not taking: Reported on 12/14/2018 11/02/18   Caccavale, Sophia, PA-C  oxyCODONE-acetaminophen (PERCOCET) 10-325 MG tablet Take 1 tablet by mouth every 8 (eight) hours as needed for pain. Patient not taking: Reported on 12/14/2018 08/15/18   Wallene Huh, DPM  traMADol (ULTRAM) 50 MG tablet Take 1 tablet (50 mg total) by mouth every 8 (eight) hours as needed. Patient not taking: Reported on 12/14/2018 08/29/18   Evelina Bucy, DPM    Family History Family History  Problem Relation Age of Onset  . Asthma Mother   . Hypertension Mother   . Diabetes Father   . Cancer Maternal Grandfather        throat  . Other Neg Hx   . Hearing loss Neg Hx     Social History Social History   Tobacco Use  . Smoking status: Current Some Day Smoker    Years: 15.00    Types: Cigarettes  . Smokeless tobacco: Never Used  Substance Use Topics  . Alcohol use: Yes    Comment:  occassional few times a month  LAST DRANK- FRIDAY-  BEER AND LIQUOR  . Drug use: Yes    Frequency: 2.0 times per week    Types: Marijuana     Allergies   Penicillins and Tuberculin tests   Review of Systems Review of Systems  Respiratory: Positive for cough.   All other systems reviewed and are negative.    Physical Exam Updated Vital Signs BP (!) 124/96   Pulse 81   Temp 98.7 F (37.1 C)   Resp 20   Ht 5\' 5"  (1.651 m)   Wt 82.6 kg   LMP 11/14/2018 (Approximate)   SpO2 100%   BMI 30.29 kg/m   Physical Exam Vitals signs and nursing note reviewed.  Constitutional:      Appearance: Normal appearance.  HENT:     Head: Normocephalic.     Nose: Nose normal.     Mouth/Throat:     Mouth: Mucous membranes are moist.  Eyes:     Extraocular Movements: Extraocular movements intact.     Pupils: Pupils are equal, round, and reactive to light.  Neck:     Musculoskeletal: Normal range of motion and neck supple.  Cardiovascular:      Rate and Rhythm: Normal rate and regular rhythm.  Pulmonary:     Comments: Diminished bilateral bases  Abdominal:     General: Abdomen is flat.     Palpations: Abdomen is soft.  Musculoskeletal: Normal range of motion.  Skin:    Capillary Refill: Capillary refill takes less than 2 seconds.  Neurological:     General: No focal deficit present.     Mental Status: She is alert and oriented to person, place, and time.  Psychiatric:        Mood and Affect: Mood normal.        Behavior: Behavior normal.      ED Treatments / Results  Labs (all labs ordered are listed, but only abnormal results are displayed) Labs Reviewed  CBC WITH DIFFERENTIAL/PLATELET - Abnormal; Notable for the following components:      Result Value   Hemoglobin 9.5 (*)    HCT 32.7 (*)    MCV 76.4 (*)    MCH 22.2 (*)    MCHC 29.1 (*)    RDW 18.9 (*)    All other components within normal limits  COMPREHENSIVE METABOLIC PANEL  I-STAT BETA HCG BLOOD, ED ([facility], [facility], [facility] ONLY)    EKG EKG Interpretation  Date/Time:  Thursday December 14 2018 10:00:18 EST Ventricular Rate:  84 PR Interval:    QRS Duration: 77 QT Interval:  370 QTC Calculation: 438 R Axis:   27 Text Interpretation:  Sinus rhythm Low voltage, precordial leads Borderline T abnormalities, inferior leads No significant change since last tracing Confirmed by Wandra Arthurs [phone]) on 12/14/2018 10:09:06 AM   Radiology Dg Chest 2 View  Result Date: 12/14/2018 CLINICAL DATA:  Productive cough for several days EXAM: CHEST - 2 VIEW COMPARISON:  11/02/2018 FINDINGS: The heart size and mediastinal contours are within normal limits. Both lungs are clear. The visualized skeletal structures are unremarkable. IMPRESSION: No active cardiopulmonary disease. Electronically Signed   By: Inez Catalina M.D.   On: 12/14/2018 10:08    Procedures Procedures (including critical care time)  Medications Ordered in ED Medications  sodium chloride 0.9 % bolus 1,000 mL  ( Intravenous Rate/Dose Verify 12/14/18 1049)  ketorolac (TORADOL) 30 MG/ML injection 30 mg (30 mg Intravenous Given 12/14/18 1026)  doxycycline (VIBRA-TABS)  tablet 100 mg (100 mg Oral Given 12/14/18 1047)     Initial Impression / Assessment and Plan / ED Course  I have reviewed the triage vital signs and the nursing notes.  Pertinent labs & imaging results that were available during my care of the patient were reviewed by me and considered in my medical decision making (see chart for details).    Tammie Brown is a 37 y.o. female here with cough, chills. I think likely bronchitis vs early pneumonia. She does have productive cough with greenish sputum. Chest pain likely from coughing. Will get labs, CXR. Will hydrate and reassess.   10:54 AM Labs showed nl WBC. CXR showed no obvious pneumonia. On my view, possible RML pneumonia. Will dc home with course of doxycycline.    Final Clinical Impressions(s) / ED Diagnoses   Final diagnoses:  None    ED Discharge Orders    None       Drenda Freeze, MD 12/14/18 1054

## 2018-12-14 NOTE — ED Notes (Signed)
Pt provided ice chips.

## 2018-12-14 NOTE — Discharge Instructions (Signed)
Take doxycycline twice daily for possible early pneumonia.   Stay hydrated   You can take tessalon pearls as needed for cough   See your doctor next week   Return to ER if you have worse cough, fever, vomiting, dehydration

## 2019-01-15 ENCOUNTER — Other Ambulatory Visit: Payer: Self-pay

## 2019-01-15 ENCOUNTER — Emergency Department (HOSPITAL_COMMUNITY)
Admission: EM | Admit: 2019-01-15 | Discharge: 2019-01-15 | Disposition: A | Discharge status: Home / Self Care | Payer: Medicaid Other | Attending: Emergency Medicine | Admitting: Emergency Medicine

## 2019-01-15 ENCOUNTER — Encounter (HOSPITAL_COMMUNITY): Payer: Self-pay

## 2019-01-15 DIAGNOSIS — F17210 Nicotine dependence, cigarettes, uncomplicated: Secondary | ICD-10-CM | POA: Insufficient documentation

## 2019-01-15 DIAGNOSIS — J111 Influenza due to unidentified influenza virus with other respiratory manifestations: Secondary | ICD-10-CM | POA: Insufficient documentation

## 2019-01-15 DIAGNOSIS — R05 Cough: Secondary | ICD-10-CM | POA: Diagnosis present

## 2019-01-15 DIAGNOSIS — R69 Illness, unspecified: Secondary | ICD-10-CM

## 2019-01-15 MED ORDER — OSELTAMIVIR PHOSPHATE 75 MG PO CAPS: 75.0000 mg | ORAL_CAPSULE | Freq: Two times a day (BID) | ORAL | 0 refills | Status: DC

## 2019-01-15 NOTE — ED Triage Notes (Signed)
Pt c/o general body aches, headache, sore throat, SOB, congestion and cough since last night. Dx with Bronchitis last month.

## 2019-01-15 NOTE — ED Provider Notes (Signed)
[facility] DEPT Provider Note   CSN: [id number] Arrival date & time: 01/15/19  1308     History   Chief Complaint Chief Complaint  Patient presents with  . Generalized Body Aches  . Shortness of Breath  . Nasal Congestion  . Sore Throat  . Cough    HPI Tammie Brown is a 38 y.o. female.  37 year old female presents with 2-day history of cough which is been nonproductive with associated scratchy throat and nasal congestion.  No vomiting but some watery diarrhea.  No abdominal or chest discomfort.  Mild frontal headache without photophobia or neck pain.  Denies sick exposures.  No rashes noted.  Denies any urinary symptoms.  Has been using over-the-counter medications without relief.     Past Medical History:  Diagnosis Date  . Chlamydia   . Gonorrhea   . Headache(784.0)   . Heart murmur   . PID (acute pelvic inflammatory disease)   . Urinary tract infection     Patient Active Problem List   Diagnosis Date Noted  . Smoker 04/08/2014  . Contraception management 04/08/2014  . Gonorrhea 08/17/2012  . 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 APPENDECTOMY    . CESAREAN SECTION    . CESAREAN SECTION  2001 & 2009  . FOOT SURGERY Left      OB History    Gravida  2   Para  2   Term  2   Preterm      AB      Living  2     SAB      TAB      Ectopic      Multiple      Live Births  1            Home Medications    Prior to Admission medications   Medication Sig Start Date End Date Taking? Authorizing Provider  benzonatate (TESSALON) 100 MG capsule Take 1 capsule (100 mg total) by mouth every 8 (eight) hours. 12/14/18   Drenda Freeze, MD  doxycycline (VIBRAMYCIN) 100 MG capsule Take 1 capsule (100 mg total) by mouth 2 (two) times daily. One po bid x 7 days 12/14/18   Drenda Freeze, MD  guaiFENesin (MUCINEX) 600 MG 12 hr tablet Take 600 mg by mouth 2  (two) times daily.    [provider]  HYDROcodone-acetaminophen (NORCO) 5-325 MG tablet Take 1 tablet by mouth every 6 (six) hours as needed for moderate pain. Patient not taking: Reported on 12/14/2018 07/13/18   Evelina Bucy, DPM  methocarbamol (ROBAXIN) 500 MG tablet Take 1 tablet (500 mg total) by mouth at bedtime as needed for muscle spasms. Patient not taking: Reported on 12/14/2018 11/02/18   Caccavale, Sophia, PA-C  oxyCODONE-acetaminophen (PERCOCET) 10-325 MG tablet Take 1 tablet by mouth every 8 (eight) hours as needed for pain. Patient not taking: Reported on 12/14/2018 08/15/18   Wallene Huh, DPM  traMADol (ULTRAM) 50 MG tablet Take 1 tablet (50 mg total) by mouth every 8 (eight) hours as needed. Patient not taking: Reported on 12/14/2018 08/29/18   Evelina Bucy, DPM    Family History Family History  Problem Relation Age of Onset  . Asthma Mother   . Hypertension Mother   . Diabetes Father   . Cancer Maternal Grandfather        throat  . Other Neg Hx   . Hearing loss  Neg Hx     Social History Social History   Tobacco Use  . Smoking status: Current Some Day Smoker    Years: 15.00    Types: Cigarettes  . Smokeless tobacco: Never Used  Substance Use Topics  . Alcohol use: Yes    Comment: occassional few times a month  LAST DRANK- FRIDAY-  BEER AND LIQUOR  . Drug use: Yes    Frequency: 2.0 times per week    Types: Marijuana     Allergies   Penicillins and Tuberculin tests   Review of Systems Review of Systems  All other systems reviewed and are negative.    Physical Exam Updated Vital Signs BP 129/76   Pulse 76   Temp 98.4 F (36.9 C) (Oral)   Resp 18   Ht 1.626 m (5\' 4" )   Wt 83.9 kg   SpO2 100%   BMI 31.76 kg/m   Physical Exam Vitals signs and nursing note reviewed.  Constitutional:      General: She is not in acute distress.    Appearance: Normal appearance. She is well-developed. She is not toxic-appearing.  HENT:      Head: Normocephalic and atraumatic.  Eyes:     General: Lids are normal.     Conjunctiva/sclera: Conjunctivae normal.     Pupils: Pupils are equal, round, and reactive to light.  Neck:     Musculoskeletal: Normal range of motion and neck supple. No neck rigidity, spinous process tenderness or muscular tenderness.     Thyroid: No thyroid mass.     Trachea: No tracheal deviation.  Cardiovascular:     Rate and Rhythm: Normal rate and regular rhythm.     Heart sounds: Normal heart sounds. No murmur. No gallop.   Pulmonary:     Effort: Pulmonary effort is normal. No respiratory distress.     Breath sounds: Normal breath sounds. No stridor. No decreased breath sounds, wheezing, rhonchi or rales.  Abdominal:     General: Bowel sounds are normal. There is no distension.     Palpations: Abdomen is soft.     Tenderness: There is no abdominal tenderness. There is no rebound.  Musculoskeletal: Normal range of motion.        General: No tenderness.  Skin:    General: Skin is warm and dry.     Findings: No abrasion or rash.  Neurological:     Mental Status: She is alert and oriented to person, place, and time.     GCS: GCS eye subscore is 4. GCS verbal subscore is 5. GCS motor subscore is 6.     Cranial Nerves: No cranial nerve deficit.     Sensory: No sensory deficit.  Psychiatric:        Speech: Speech normal.        Behavior: Behavior normal.      ED Treatments / Results  Labs (all labs ordered are listed, but only abnormal results are displayed) Labs Reviewed - No data to display  EKG None  Radiology No results found.  Procedures Procedures (including critical care time)  Medications Ordered in ED Medications - No data to display   Initial Impression / Assessment and Plan / ED Course  I have reviewed the triage vital signs and the nursing notes.  Pertinent labs & imaging results that were available during my care of the patient were reviewed by me and considered in my  medical decision making (see chart for details).     Patient with  likely viral illness.  Will place on Tamiflu and discharge  Final Clinical Impressions(s) / ED Diagnoses   Final diagnoses:  None    ED Discharge Orders    None       Lacretia Leigh, MD 01/15/19 1018

## 2019-06-20 ENCOUNTER — Emergency Department (HOSPITAL_COMMUNITY): Payer: Medicaid Other

## 2019-06-20 ENCOUNTER — Other Ambulatory Visit: Payer: Self-pay

## 2019-06-20 ENCOUNTER — Emergency Department (HOSPITAL_COMMUNITY)
Admission: EM | Admit: 2019-06-20 | Discharge: 2019-06-20 | Disposition: A | Discharge status: Home / Self Care | Payer: Medicaid Other | Attending: Emergency Medicine | Admitting: Emergency Medicine

## 2019-06-20 ENCOUNTER — Encounter (HOSPITAL_COMMUNITY): Payer: Self-pay | Admitting: Emergency Medicine

## 2019-06-20 DIAGNOSIS — R0789 Other chest pain: Secondary | ICD-10-CM

## 2019-06-20 DIAGNOSIS — J36 Peritonsillar abscess: Secondary | ICD-10-CM | POA: Diagnosis not present

## 2019-06-20 DIAGNOSIS — J020 Streptococcal pharyngitis: Secondary | ICD-10-CM | POA: Diagnosis not present

## 2019-06-20 DIAGNOSIS — R590 Localized enlarged lymph nodes: Secondary | ICD-10-CM | POA: Diagnosis not present

## 2019-06-20 DIAGNOSIS — F1290 Cannabis use, unspecified, uncomplicated: Secondary | ICD-10-CM | POA: Diagnosis not present

## 2019-06-20 DIAGNOSIS — R070 Pain in throat: Secondary | ICD-10-CM | POA: Diagnosis present

## 2019-06-20 DIAGNOSIS — F17210 Nicotine dependence, cigarettes, uncomplicated: Secondary | ICD-10-CM | POA: Insufficient documentation

## 2019-06-20 LAB — I-STAT CHEM 8, ED
BUN: 8 mg/dL (ref 6–20)
Calcium, Ion: 1.23 mmol/L (ref 1.15–1.40)
Chloride: 109 mmol/L (ref 98–111)
Creatinine, Ser: 0.8 mg/dL (ref 0.44–1.00)
Glucose, Bld: 73 mg/dL (ref 70–99)
HCT: 40 % (ref 36.0–46.0)
Hemoglobin: 13.6 g/dL (ref 12.0–15.0)
Potassium: 4 mmol/L (ref 3.5–5.1)
Sodium: 138 mmol/L (ref 135–145)
TCO2: 22 mmol/L (ref 22–32)

## 2019-06-20 LAB — GROUP A STREP BY PCR: Group A Strep by PCR: DETECTED — AB

## 2019-06-20 MED ORDER — DEXAMETHASONE SODIUM PHOSPHATE 10 MG/ML IJ SOLN
10.0000 mg | Freq: Once | INTRAMUSCULAR | Status: AC
  Administered 2019-06-20: 10 mg via INTRAMUSCULAR
  Filled 2019-06-20: qty 1

## 2019-06-20 MED ORDER — IOHEXOL 300 MG/ML  SOLN
75.0000 mL | Freq: Once | INTRAMUSCULAR | Status: AC | PRN
  Administered 2019-06-20: 75 mL via INTRAVENOUS

## 2019-06-20 MED ORDER — CLINDAMYCIN HCL 150 MG PO CAPS
600.0000 mg | ORAL_CAPSULE | Freq: Once | ORAL | Status: AC
  Administered 2019-06-20: 600 mg via ORAL
  Filled 2019-06-20: qty 4

## 2019-06-20 MED ORDER — CLINDAMYCIN HCL 150 MG PO CAPS: 450.0000 mg | ORAL_CAPSULE | Freq: Three times a day (TID) | ORAL | 0 refills | Status: AC
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
## 2019-06-20 NOTE — ED Notes (Signed)
Patient transported to CT 
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
## 2019-06-20 NOTE — Discharge Instructions (Addendum)
You were given a prescription for antibiotics. Please take the antibiotic prescription fully.  This antibiotic may cause you to have an upset stomach or diarrhea.  You may alternate taking Tylenol and Ibuprofen as needed for pain control. You may take 400-600 mg of ibuprofen every 6 hours and [id number] mg of Tylenol every 6 hours. Do not exceed 4000 mg of Tylenol daily as this can lead to liver damage. Also, make sure to take Ibuprofen with meals as it can cause an upset stomach. Do not take other NSAIDs while taking Ibuprofen such as (Aleve, Naprosyn, Aspirin, Celebrex, etc) and do not take more than the prescribed dose as this can lead to ulcers and bleeding in your GI tract. You may use warm and cold compresses to help with your symptoms.   You were given a referral to the ear nose and throat doctor.  Please call the office tomorrow to schedule an appointment for follow-up in the next 1 to 2 days.  Please return to the ER sooner if you have any new or worsening symptoms including any worsening swelling of your throat, worsening pain in your throat, inability to swallow or tolerate your own saliva, fevers.

## 2019-06-22 DIAGNOSIS — J36 Peritonsillar abscess: Secondary | ICD-10-CM | POA: Insufficient documentation

## 2019-07-06 ENCOUNTER — Ambulatory Visit: Payer: Medicaid Other | Admitting: Podiatry

## 2019-07-12 ENCOUNTER — Telehealth: Payer: Self-pay | Admitting: Podiatry

## 2019-07-12 ENCOUNTER — Encounter: Payer: Self-pay | Admitting: Podiatry

## 2019-07-12 ENCOUNTER — Ambulatory Visit: Payer: Medicaid Other | Admitting: Podiatry

## 2019-07-12 ENCOUNTER — Other Ambulatory Visit: Payer: Self-pay

## 2019-07-12 VITALS — Temp 98.3°F

## 2019-07-12 DIAGNOSIS — M722 Plantar fascial fibromatosis: Secondary | ICD-10-CM

## 2019-07-12 NOTE — Telephone Encounter (Signed)
Patient was seen in office today and had an injection for plantar fibroma. Pt is calling to request that the doctor send in a pain medication for her to help with the pain.

## 2019-07-13 ENCOUNTER — Telehealth: Payer: Self-pay

## 2019-07-13 ENCOUNTER — Telehealth: Payer: Self-pay | Admitting: Podiatry

## 2019-07-13 MED ORDER — IBUPROFEN 800 MG PO TABS: 800.0000 mg | ORAL_TABLET | Freq: Three times a day (TID) | ORAL | 0 refills | Status: AC | PRN

## 2019-07-13 MED ORDER — TRAMADOL HCL 50 MG PO TABS: 50.0000 mg | ORAL_TABLET | Freq: Three times a day (TID) | ORAL | 0 refills | Status: AC | PRN

## 2019-07-13 NOTE — Addendum Note (Signed)
Addended by: Harriett Sine D on: 07/13/2019 01:03 PM   Modules accepted: Orders

## 2019-07-13 NOTE — Telephone Encounter (Signed)
Returned patient call requesting pain medication. Informed pt 800mg  Ibuprofen could be prescribed. Pt stated she would like this prescription.

## 2019-07-13 NOTE — Telephone Encounter (Signed)
Left message for tramadol at [place].

## 2019-07-13 NOTE — Telephone Encounter (Signed)
Pt is requesting pain medication for her left foot, patient was seen by Dr. March Rummage yesterday

## 2019-07-13 NOTE — Telephone Encounter (Signed)
Dr. March Rummage ordered Tramadol 50mg  #6 take one every 8 hours. I informed pt and she states she can't take the Ibuprofen it constipates her and she stated the tramadol does too. I told pt that Dr. March Rummage stated that for her diagnosis he would only prescribe the tramadol. Pt states understanding and will take it and continue to ice. I told pt I would call to the [facility].

## 2019-07-30 NOTE — Progress Notes (Signed)
Subjective:  Patient ID: Tammie Brown, female    DOB: March 16, 1981,  MRN: [id number]  Chief Complaint  Patient presents with  . Foot Pain    pt is here for plantar fibroma, pt states that she is in a lot of pain, and is looking to get a medication for it    38 y.o. female presents with the above complaint. Hx as above.  Review of Systems: Negative except as noted in the HPI. Denies N/V/F/Ch.  Past Medical History:  Diagnosis Date  . Chlamydia   . Gonorrhea   . Headache(784.0)   . Heart murmur   . PID (acute pelvic inflammatory disease)   . Urinary tract infection     Current Outpatient Medications:  .  benzonatate (TESSALON) 100 MG capsule, Take 1 capsule (100 mg total) by mouth every 8 (eight) hours., Disp: 10 capsule, Rfl: 0 .  doxycycline (VIBRAMYCIN) 100 MG capsule, Take 1 capsule (100 mg total) by mouth 2 (two) times daily. One po bid x 7 days, Disp: 14 capsule, Rfl: 0 .  guaiFENesin (MUCINEX) 600 MG 12 hr tablet, Take 600 mg by mouth 2 (two) times daily., Disp: , Rfl:  .  HYDROcodone-acetaminophen (NORCO) 5-325 MG tablet, Take 1 tablet by mouth every 6 (six) hours as needed for moderate pain., Disp: 15 tablet, Rfl: 0 .  ibuprofen (ADVIL) 800 MG tablet, , Disp: , Rfl:  .  methocarbamol (ROBAXIN) 500 MG tablet, Take 1 tablet (500 mg total) by mouth at bedtime as needed for muscle spasms., Disp: 14 tablet, Rfl: 0 .  Multiple Vitamin (MULTI-VITAMIN) tablet, Take by mouth., Disp: , Rfl:  .  oseltamivir (TAMIFLU) 75 MG capsule, Take 1 capsule (75 mg total) by mouth every 12 (twelve) hours., Disp: 10 capsule, Rfl: 0 .  oxyCODONE-acetaminophen (PERCOCET) 10-325 MG tablet, Take 1 tablet by mouth every 8 (eight) hours as needed for pain., Disp: 20 tablet, Rfl: 0 .  traMADol (ULTRAM) 50 MG tablet, Take 1 tablet (50 mg total) by mouth every 8 (eight) hours as needed., Disp: 30 tablet, Rfl: 0 .  ibuprofen (ADVIL) 800 MG tablet, Take 1 tablet (800 mg total) by mouth every 8 (eight) hours  as needed., Disp: 30 tablet, Rfl: 0  Social History   Tobacco Use  Smoking Status Current Some Day Smoker  . Years: 15.00  . Types: Cigarettes  Smokeless Tobacco Never Used    Allergies  Allergen Reactions  . Penicillins Rash    Has patient had a PCN reaction causing immediate rash, facial/tongue/throat swelling, SOB or lightheadedness with hypotension: No Has patient had a PCN reaction causing severe rash involving mucus membranes or skin necrosis: yes mouth Has patient had a PCN reaction that required hospitalization No Has patient had a PCN reaction occurring within the last 10 years: Yes 5 or 6 years ago If all of the above answers are "NO", then may proceed with Cephalosporin use.   . Tuberculin Tests Itching, Palpitations and Other (See Comments)    Reaction:  Made pts mouth feel really cold   Objective:   Vitals:   07/12/19 1529  Temp: 98.3 F (36.8 C)   There is no height or weight on file to calculate BMI. Constitutional Well developed. Well nourished.  Vascular Dorsalis pedis pulses palpable bilaterally. Posterior tibial pulses palpable bilaterally. Capillary refill normal to all digits.  No cyanosis or clubbing noted. Pedal hair growth normal.  Neurologic Normal speech. Oriented to person, place, and time. Epicritic sensation to light touch grossly  present bilaterally.  Dermatologic  thick scar area of previous surgical excision with palpable scar tissue  Orthopedic:  Pain palpation about the left medial arch with firm nodule   Radiographs: None Assessment:   1. Plantar fibromatosis    Plan:  Patient was evaluated and treated and all questions answered.  Plantar fibroma left -Injection delivered as below -Discussed possible surgical excision should pain persist  Procedure: Injection Tendon/Ligament Consent: Verbal consent obtained. Location: Left plantar fibroma Skin Prep: Alcohol. Injectate: 0.5 cc 0.5% marcaine plain, 0.5 cc dexamethasone  phosphate, Disposition: Patient tolerated procedure reasonably. Injection site dressed with a band-aid.     Return in about 6 weeks (around 08/23/2019) for Fibroma f/u.

## 2019-08-09 ENCOUNTER — Ambulatory Visit: Payer: Medicaid Other | Admitting: Advanced Practice Midwife

## 2019-08-23 ENCOUNTER — Ambulatory Visit (INDEPENDENT_AMBULATORY_CARE_PROVIDER_SITE_OTHER): Payer: Medicaid Other | Admitting: Podiatry

## 2019-08-23 DIAGNOSIS — Z5329 Procedure and treatment not carried out because of patient's decision for other reasons: Secondary | ICD-10-CM

## 2019-08-23 NOTE — Progress Notes (Signed)
No show for appt. 

## 2019-09-07 ENCOUNTER — Ambulatory Visit: Payer: Medicaid Other | Admitting: Podiatry

## 2019-09-10 ENCOUNTER — Telehealth: Payer: Self-pay | Admitting: *Deleted

## 2019-09-10 ENCOUNTER — Other Ambulatory Visit: Payer: Self-pay

## 2019-09-10 ENCOUNTER — Ambulatory Visit (INDEPENDENT_AMBULATORY_CARE_PROVIDER_SITE_OTHER): Payer: Medicaid Other | Admitting: Podiatry

## 2019-09-10 ENCOUNTER — Encounter: Payer: Self-pay | Admitting: Podiatry

## 2019-09-10 DIAGNOSIS — M722 Plantar fascial fibromatosis: Secondary | ICD-10-CM | POA: Diagnosis not present

## 2019-09-10 DIAGNOSIS — M79672 Pain in left foot: Secondary | ICD-10-CM

## 2019-09-10 DIAGNOSIS — Z9889 Other specified postprocedural states: Secondary | ICD-10-CM

## 2019-09-10 NOTE — Telephone Encounter (Signed)
-----   Message from Trula Slade, DPM sent at 09/10/2019  8:43 AM EDT ----- Can you please order an MRI of the left foot to evaluate soft tissue mass? She had a plantar fibroma excision last year with reoccurrence of the lesion and pain.

## 2019-09-10 NOTE — Progress Notes (Addendum)
Subjective: 38 year old female presents the office today for concerns of pain to the left foot.  She states after the injection last month the area did shrink however she still having quite a bit.  Patient developed a callus been on the scar.  She is been using urea cream to the area is also pumice stone.  She previously underwent plantar fibroma excision last year with Dr. March Rummage.  Unfortunately she started to have recurrence of the lesion. Denies any systemic complaints such as fevers, chills, nausea, vomiting. No acute changes since last appointment, and no other complaints at this time.   Objective: AAO x3, NAD DP/PT pulses palpable bilaterally, CRT less than 3 seconds On incision plantar aspect of right foot is a hyperkeratotic tissue.  Seems quite tender.  There is a firm soft tissue mass present underneath this. Appears to be about a 1 x 1 cm lesion.  There is no crepitation.  No edema, erythema.  No drainage or possibly signs of infection. No pain with calf compression, swelling, warmth, erythema  Assessment: Soft tissue mass left foot with hyperkeratotic tissue, pain  Plan: -All treatment options discussed with the patient including all alternatives, risks, complications.  -I tried to trim some of the hyperkeratotic tissue however was quite painful for her so I did not do this.  Recommend continue the urea cream as well as the pumice stone.  I would order an MRI.  Dispensed a medial offloading pads.  Follow-up with Dr. Dr. March Rummage after the MRI. -Patient encouraged to call the office with any questions, concerns, change in symptoms.   Trula Slade DPM

## 2019-09-11 NOTE — Telephone Encounter (Signed)
[employer] - Medicaid requires clinical notes prior to pre-cert 0000000 left foot Case [place]:[id number]. Clinicals prepared to be faxed to [place] [facility] once 09/10/2019 is available.

## 2019-09-12 NOTE — Telephone Encounter (Signed)
Faxed clinicals including 09/10/2019 notes to [facility] for MRI [id number] left foot with and without contrast Case:  [id number].

## 2019-09-27 ENCOUNTER — Other Ambulatory Visit: Payer: Medicaid Other

## 2019-10-04 ENCOUNTER — Other Ambulatory Visit: Payer: Self-pay

## 2019-10-04 ENCOUNTER — Ambulatory Visit
Admission: RE | Admit: 2019-10-04 | Discharge: 2019-10-04 | Disposition: A | Discharge status: Home / Self Care | Payer: Medicaid Other | Source: Ambulatory Visit | Attending: Podiatry | Admitting: Podiatry

## 2019-10-04 DIAGNOSIS — M79672 Pain in left foot: Secondary | ICD-10-CM

## 2019-10-04 DIAGNOSIS — M722 Plantar fascial fibromatosis: Secondary | ICD-10-CM

## 2019-10-04 DIAGNOSIS — Z9889 Other specified postprocedural states: Secondary | ICD-10-CM

## 2019-10-04 MED ORDER — GADOBENATE DIMEGLUMINE 529 MG/ML IV SOLN
18.0000 mL | Freq: Once | INTRAVENOUS | Status: AC | PRN
  Administered 2019-10-04: 18 mL via INTRAVENOUS

## 2019-10-08 ENCOUNTER — Telehealth: Payer: Self-pay | Admitting: *Deleted

## 2019-10-08 NOTE — Telephone Encounter (Signed)
Pt request MRI results.  °

## 2019-10-09 NOTE — Telephone Encounter (Signed)
Pt called for MRI results.

## 2019-10-10 NOTE — Patient Outreach (Signed)
Called patient and went over results. Will likely plan for surgery. Made appt for patient to further discuss.

## 2019-11-08 ENCOUNTER — Other Ambulatory Visit: Payer: Self-pay

## 2019-11-08 ENCOUNTER — Ambulatory Visit (INDEPENDENT_AMBULATORY_CARE_PROVIDER_SITE_OTHER): Payer: Medicaid Other | Admitting: Podiatry

## 2019-11-08 DIAGNOSIS — D219 Benign neoplasm of connective and other soft tissue, unspecified: Secondary | ICD-10-CM

## 2019-11-08 DIAGNOSIS — M722 Plantar fascial fibromatosis: Secondary | ICD-10-CM | POA: Diagnosis not present

## 2019-11-08 DIAGNOSIS — M79672 Pain in left foot: Secondary | ICD-10-CM

## 2019-11-08 NOTE — Patient Instructions (Addendum)
Pre-Operative Instructions  Congratulations, you have decided to take an important step towards improving your quality of life.  You can be assured that the doctors and staff at [facility] will be with you every step of the way.  Here are some important things you should know:  1. Plan to be at the surgery center/hospital at least 1 (one) hour prior to your scheduled time, unless otherwise directed by the surgical center/hospital staff.  You must have a responsible adult accompany you, remain during the surgery and drive you home.  Make sure you have directions to the surgical center/hospital to ensure you arrive on time. 2. If you are having surgery at [facility] or [facility], you will need a copy of your medical history and physical form from your family physician within one month prior to the date of surgery. We will give you a form for your primary physician to complete.  3. We make every effort to accommodate the date you request for surgery.  However, there are times where surgery dates or times have to be moved.  We will contact you as soon as possible if a change in schedule is required.   4. No aspirin/ibuprofen for one week before surgery.  If you are on aspirin, any non-steroidal anti-inflammatory medications (Mobic, Aleve, Ibuprofen) should not be taken seven (7) days prior to your surgery.  You make take Tylenol for pain prior to surgery.  5. Medications - If you are taking daily heart and blood pressure medications, seizure, reflux, allergy, asthma, anxiety, pain or diabetes medications, make sure you notify the surgery center/hospital before the day of surgery so they can tell you which medications you should take or avoid the day of surgery. 6. No food or drink after midnight the night before surgery unless directed otherwise by surgical center/hospital staff. 7. No alcoholic beverages 24-hours prior to surgery.  No smoking 24-hours prior or 24-hours after  surgery. 8. Wear loose pants or shorts. They should be loose enough to fit over bandages, boots, and casts. 9. Don't wear slip-on shoes. Sneakers are preferred. 10. Bring your boot with you to the surgery center/hospital.  Also bring crutches or a walker if your physician has prescribed it for you.  If you do not have this equipment, it will be provided for you after surgery. 11. If you have not been contacted by the surgery center/hospital by the day before your surgery, call to confirm the date and time of your surgery. 12. Leave-time from work may vary depending on the type of surgery you have.  Appropriate arrangements should be made prior to surgery with your employer. 13. Prescriptions will be provided immediately following surgery by your doctor.  Fill these as soon as possible after surgery and take the medication as directed. Pain medications will not be refilled on weekends and must be approved by the doctor. 14. Remove nail polish on the operative foot and avoid getting pedicures prior to surgery. 15. Wash the night before surgery.  The night before surgery wash the foot and leg well with water and the antibacterial soap provided. Be sure to pay special attention to beneath the toenails and in between the toes.  Wash for at least three (3) minutes. Rinse thoroughly with water and dry well with a towel.  Perform this wash unless told not to do so by your physician.  Enclosed: 1 Ice pack (please put in freezer the night before surgery)   1 Hibiclens skin cleaner     Pre-op instructions  If you have any questions regarding the instructions, please do not hesitate to call our office.  [place]: [address], [place], [place] [address] -- [phone]  [place]: [address] [place], [place] [address] -- [phone]  [place]: [address]  [place], [place] [address] -- [phone]   Website: [email or web address] 

## 2019-11-29 ENCOUNTER — Telehealth: Payer: Self-pay | Admitting: *Deleted

## 2019-11-29 NOTE — Telephone Encounter (Signed)
"  They said that the CPT code you all gave was not a billable code.  Do you have another code we can use?"  Are you referring to the 19090?  "Yes, I am.  Can we use the other code that is linked to Excision Benign Lesion?"  Yes, you can use the 11422.   "I'll let them know."

## 2019-12-05 ENCOUNTER — Telehealth: Payer: Self-pay | Admitting: Podiatry

## 2019-12-05 ENCOUNTER — Other Ambulatory Visit (INDEPENDENT_AMBULATORY_CARE_PROVIDER_SITE_OTHER): Payer: Self-pay | Admitting: Podiatry

## 2019-12-05 ENCOUNTER — Other Ambulatory Visit: Payer: Self-pay | Admitting: Podiatry

## 2019-12-05 ENCOUNTER — Encounter: Payer: Self-pay | Admitting: Podiatry

## 2019-12-05 DIAGNOSIS — G8918 Other acute postprocedural pain: Secondary | ICD-10-CM

## 2019-12-05 DIAGNOSIS — M216X2 Other acquired deformities of left foot: Secondary | ICD-10-CM

## 2019-12-05 DIAGNOSIS — D492 Neoplasm of unspecified behavior of bone, soft tissue, and skin: Secondary | ICD-10-CM | POA: Diagnosis not present

## 2019-12-05 MED ORDER — OXYCODONE-ACETAMINOPHEN 10-325 MG PO TABS: 1.0000 | ORAL_TABLET | ORAL | 0 refills | Status: DC | PRN

## 2019-12-05 MED ORDER — ONDANSETRON HCL 4 MG PO TABS: 4.0000 mg | ORAL_TABLET | Freq: Three times a day (TID) | ORAL | 0 refills | Status: DC | PRN

## 2019-12-05 MED ORDER — OXYCODONE-ACETAMINOPHEN 10-325 MG PO TABS: 1.0000 | ORAL_TABLET | Freq: Three times a day (TID) | ORAL | 0 refills | Status: DC | PRN

## 2019-12-05 MED ORDER — CLINDAMYCIN HCL 150 MG PO CAPS: 150.0000 mg | ORAL_CAPSULE | Freq: Two times a day (BID) | ORAL | 0 refills | Status: DC

## 2019-12-05 NOTE — Telephone Encounter (Signed)
Pt called states the pain medication is not at the [facility] on [address] it is still at the [place] [place].

## 2019-12-05 NOTE — Progress Notes (Addendum)
Rx sent to pharmacy for outpatient surgery. °

## 2019-12-05 NOTE — Telephone Encounter (Signed)
Patient stated that the [facility] on [address] did not get her pain meds and that it was sent somewhere else and didn't tell her where. She asked that it be resent to the [facility] on [address].

## 2019-12-05 NOTE — Progress Notes (Signed)
Dr. March Rummage in surgery. Sent Rx for Percocet 10/325 mg, #20 for postoperative pain. Take 1 tab by mouth q4h pain.

## 2019-12-05 NOTE — Telephone Encounter (Signed)
I informed pt the pain medication had been reordered with [facility].

## 2019-12-05 NOTE — Addendum Note (Signed)
Addended by: Hardie Pulley on: 12/05/2019 11:59 AM   Modules accepted: Orders

## 2019-12-05 NOTE — Telephone Encounter (Signed)
I called [facility] [phone] on [address] stated the percocet was not there it was somewhere else, then she transferred to [facility] - Pharmacist and he states pt picked up the percocet at the [facility] on [address] at 3:08pm, and he cancelled out the Dr. Elisha Ponder prescription of 1:22pm.

## 2019-12-06 ENCOUNTER — Telehealth: Payer: Self-pay

## 2019-12-06 NOTE — Telephone Encounter (Signed)
POST OP CALL-    1) General condition stated by the patient:   2) Is the pt having pain? Not much     3) Pain score:   4) Has the pt taken Rx'd pain medication, regularly or PRN? As needed  5) Is the pain medication giving relief? Yes  6) Any fever, chills, nausea, or vomiting, shortness of breath or tightness in calf? None  7) Is the bandage clean, dry and intact? Yes  8) Is there excessive tightness, bleeding or drainage coming through the bandage? None  9) Did you understand all of the post op instruction sheet given? Yes  10) Any questions or concerns regarding post op care/recovery? Pt wanted to discuss Ibuprofen script if it was necessary to alternate medication. Pt was advised if she did not want to take her pain medicine or she felt the need to take Ibuprofen instead to call the office back and we would discuss it. As of now pt has been taking pain medication with no problems.   Confirmed POV appointment with patient

## 2019-12-12 ENCOUNTER — Telehealth: Payer: Self-pay | Admitting: Podiatry

## 2019-12-12 ENCOUNTER — Other Ambulatory Visit: Payer: Self-pay | Admitting: Sports Medicine

## 2019-12-12 DIAGNOSIS — G8918 Other acute postprocedural pain: Secondary | ICD-10-CM

## 2019-12-12 MED ORDER — OXYCODONE-ACETAMINOPHEN 10-325 MG PO TABS: 1.0000 | ORAL_TABLET | ORAL | 0 refills | Status: DC | PRN

## 2019-12-12 NOTE — Telephone Encounter (Signed)
I spoke to pt and she states the pain feels like the stitches are about to come out every time she moves her toes and burning and shoots and throbs up her ankle and the pain level is 8, and she can't sleep.

## 2019-12-12 NOTE — Progress Notes (Signed)
Refilled pain medication -Dr. Mayli Covington 

## 2019-12-12 NOTE — Telephone Encounter (Signed)
Refilled pain medication

## 2019-12-12 NOTE — Telephone Encounter (Signed)
Pt called DOS 12/05/2019 wanted a refill on pain meds

## 2019-12-13 ENCOUNTER — Ambulatory Visit (INDEPENDENT_AMBULATORY_CARE_PROVIDER_SITE_OTHER): Payer: Medicaid Other | Admitting: Podiatry

## 2019-12-13 ENCOUNTER — Other Ambulatory Visit: Payer: Self-pay

## 2019-12-13 ENCOUNTER — Encounter: Payer: Self-pay | Admitting: Podiatry

## 2019-12-13 DIAGNOSIS — G8918 Other acute postprocedural pain: Secondary | ICD-10-CM

## 2019-12-13 DIAGNOSIS — M722 Plantar fascial fibromatosis: Secondary | ICD-10-CM

## 2019-12-13 MED ORDER — HYDROCODONE-ACETAMINOPHEN 5-325 MG PO TABS: 1.0000 | ORAL_TABLET | Freq: Four times a day (QID) | ORAL | 0 refills | Status: DC | PRN

## 2019-12-17 ENCOUNTER — Telehealth: Payer: Self-pay | Admitting: *Deleted

## 2019-12-17 NOTE — Telephone Encounter (Signed)
Entered in error

## 2019-12-19 ENCOUNTER — Telehealth: Payer: Self-pay | Admitting: Podiatry

## 2019-12-19 ENCOUNTER — Other Ambulatory Visit: Payer: Self-pay

## 2019-12-19 ENCOUNTER — Ambulatory Visit (INDEPENDENT_AMBULATORY_CARE_PROVIDER_SITE_OTHER): Payer: Medicaid Other | Admitting: Podiatry

## 2019-12-19 DIAGNOSIS — Z9889 Other specified postprocedural states: Secondary | ICD-10-CM

## 2019-12-19 DIAGNOSIS — M79672 Pain in left foot: Secondary | ICD-10-CM

## 2019-12-19 DIAGNOSIS — M722 Plantar fascial fibromatosis: Secondary | ICD-10-CM

## 2019-12-19 MED ORDER — HYDROCODONE-ACETAMINOPHEN 10-325 MG PO TABS: 1.0000 | ORAL_TABLET | Freq: Three times a day (TID) | ORAL | 0 refills | Status: AC | PRN

## 2019-12-19 NOTE — Telephone Encounter (Signed)
Pt called requesting a refill on Hydrocodone, please send to [facility] on [address]

## 2019-12-19 NOTE — Progress Notes (Signed)
Subjective:  Patient ID: Tammie Brown, female    DOB: July 18, 1981,  MRN: [id number]  Chief Complaint  Patient presents with  . Post-op Follow-up    POV#2 DOS 12/05/2019 EXC. BENIGN LESION 1.0 CM AND PLANTAR FIBROMA LT - DR. PRICE. Pt stated, "The pain comes and goes - usually 8/10".    DOS: 12/05/2019 Procedure: Excision plantar fibroma, adjacent tissue transfer  38 y.o. female returns for post-op check.  History above confirmed with patient.  Having some pain but it is controlled by the pain medication  Review of Systems: Negative except as noted in the HPI. Denies N/V/F/Ch.  Past Medical History:  Diagnosis Date  . Chlamydia   . Gonorrhea   . Headache(784.0)   . Heart murmur   . PID (acute pelvic inflammatory disease)   . Urinary tract infection     Current Outpatient Medications:  .  benzonatate (TESSALON) 100 MG capsule, Take 1 capsule (100 mg total) by mouth every 8 (eight) hours., Disp: 10 capsule, Rfl: 0 .  clindamycin (CLEOCIN) 150 MG capsule, Take 1 capsule (150 mg total) by mouth 2 (two) times daily., Disp: 14 capsule, Rfl: 0 .  doxycycline (VIBRAMYCIN) 100 MG capsule, Take 1 capsule (100 mg total) by mouth 2 (two) times daily. One po bid x 7 days, Disp: 14 capsule, Rfl: 0 .  guaiFENesin (MUCINEX) 600 MG 12 hr tablet, Take 600 mg by mouth 2 (two) times daily., Disp: , Rfl:  .  HYDROcodone-acetaminophen (NORCO) 5-325 MG tablet, Take 1 tablet by mouth every 6 (six) hours as needed for moderate pain., Disp: 15 tablet, Rfl: 0 .  ibuprofen (ADVIL) 800 MG tablet, Take 1 tablet (800 mg total) by mouth every 8 (eight) hours as needed., Disp: 30 tablet, Rfl: 0 .  methocarbamol (ROBAXIN) 500 MG tablet, Take 1 tablet (500 mg total) by mouth at bedtime as needed for muscle spasms., Disp: 14 tablet, Rfl: 0 .  Multiple Vitamin (MULTI-VITAMIN) tablet, Take by mouth., Disp: , Rfl:  .  ondansetron (ZOFRAN) 4 MG tablet, Take 1 tablet (4 mg total) by mouth every 8 (eight) hours as needed  for nausea or vomiting., Disp: 20 tablet, Rfl: 0 .  oseltamivir (TAMIFLU) 75 MG capsule, Take 1 capsule (75 mg total) by mouth every 12 (twelve) hours., Disp: 10 capsule, Rfl: 0 .  traMADol (ULTRAM) 50 MG tablet, Take 1 tablet (50 mg total) by mouth every 8 (eight) hours as needed., Disp: 30 tablet, Rfl: 0 .  traZODone (DESYREL) 100 MG tablet, TAKE 1 TABLET BY MOUTH DAILY AS NEEDED AT BEDTIME., Disp: , Rfl:  .  HYDROcodone-acetaminophen (NORCO) 10-325 MG tablet, Take 1 tablet by mouth every 8 (eight) hours as needed for up to 5 days., Disp: 15 tablet, Rfl: 0  Social History   Tobacco Use  Smoking Status Current Some Day Smoker  . Years: 15.00  . Types: Cigarettes  Smokeless Tobacco Never Used    Allergies  Allergen Reactions  . Penicillins Rash    Has patient had a PCN reaction causing immediate rash, facial/tongue/throat swelling, SOB or lightheadedness with hypotension: No Has patient had a PCN reaction causing severe rash involving mucus membranes or skin necrosis: yes mouth Has patient had a PCN reaction that required hospitalization No Has patient had a PCN reaction occurring within the last 10 years: Yes 5 or 6 years ago If all of the above answers are "NO", then may proceed with Cephalosporin use.   . Tuberculin Tests Itching, Palpitations and Other (See  Comments)    Reaction:  Made pts mouth feel really cold   Objective:  There were no vitals filed for this visit. There is no height or weight on file to calculate BMI. Constitutional Well developed. Well nourished.  Vascular Foot warm and well perfused. Capillary refill normal to all digits.   Neurologic Normal speech. Oriented to person, place, and time. Epicritic sensation to light touch grossly present bilaterally.  Dermatologic Skin healing well without signs of infection. Skin edges well coapted without signs of infection.  Orthopedic: Tenderness to palpation noted about the surgical site.   Radiographs:  None Assessment:   1. Post-operative state   2. Plantar fascial fibromatosis of left foot   3. Left foot pain    Plan:  Patient was evaluated and treated and all questions answered.  S/p foot surgery left -Progressing as expected post-operatively. -XR: None -WB Status: NWB with crutches -Sutures: intact.  No clinical signs of infection noted.  Patient will continue to be nonweightbearing with crutches. -Medications: None refilled -Foot redressed.  No follow-ups on file.

## 2019-12-19 NOTE — Telephone Encounter (Addendum)
I spoke states the pain in at the staple, is a throbbing and burning sensation, constant pain, is a level of 7 when on the pain medication. I asked pt if she could take ibuprofen and she states it causes uncomfortable constipation.

## 2019-12-19 NOTE — Addendum Note (Signed)
Addended by: Wallene Huh on: 12/19/2019 01:33 PM   Modules accepted: Orders

## 2019-12-19 NOTE — Addendum Note (Signed)
Addended by: Harriett Sine D on: 12/19/2019 12:53 PM   Modules accepted: Orders

## 2019-12-19 NOTE — Telephone Encounter (Signed)
done

## 2019-12-19 NOTE — Telephone Encounter (Signed)
I informed pt of Dr. Regal's orders. 

## 2019-12-19 NOTE — Progress Notes (Signed)
Subjective:  Patient ID: Tammie Brown, female    DOB: 1981-06-25,  MRN: [id number]  Chief Complaint  Patient presents with  . Routine Post Op    12/05/2019 EXC. BENIGN LESION 1.0CM AND PLANTAR FIBROMA LT. Pt states healing well, no concerns. Pt is having some pain. Denies fever/nausea/vomiting/chills.    DOS: 12/05/2019 Procedure: Excision plantar fibroma, adjacent tissue transfer  38 y.o. female returns for post-op check.  History above confirmed with patient.  Having some pain but it is controlled by the pain medication  Review of Systems: Negative except as noted in the HPI. Denies N/V/F/Ch.  Past Medical History:  Diagnosis Date  . Chlamydia   . Gonorrhea   . Headache(784.0)   . Heart murmur   . PID (acute pelvic inflammatory disease)   . Urinary tract infection     Current Outpatient Medications:  .  benzonatate (TESSALON) 100 MG capsule, Take 1 capsule (100 mg total) by mouth every 8 (eight) hours., Disp: 10 capsule, Rfl: 0 .  clindamycin (CLEOCIN) 150 MG capsule, Take 1 capsule (150 mg total) by mouth 2 (two) times daily., Disp: 14 capsule, Rfl: 0 .  doxycycline (VIBRAMYCIN) 100 MG capsule, Take 1 capsule (100 mg total) by mouth 2 (two) times daily. One po bid x 7 days, Disp: 14 capsule, Rfl: 0 .  guaiFENesin (MUCINEX) 600 MG 12 hr tablet, Take 600 mg by mouth 2 (two) times daily., Disp: , Rfl:  .  HYDROcodone-acetaminophen (NORCO) 5-325 MG tablet, Take 1 tablet by mouth every 6 (six) hours as needed for moderate pain., Disp: 15 tablet, Rfl: 0 .  ibuprofen (ADVIL) 800 MG tablet, Take 1 tablet (800 mg total) by mouth every 8 (eight) hours as needed., Disp: 30 tablet, Rfl: 0 .  methocarbamol (ROBAXIN) 500 MG tablet, Take 1 tablet (500 mg total) by mouth at bedtime as needed for muscle spasms., Disp: 14 tablet, Rfl: 0 .  Multiple Vitamin (MULTI-VITAMIN) tablet, Take by mouth., Disp: , Rfl:  .  ondansetron (ZOFRAN) 4 MG tablet, Take 1 tablet (4 mg total) by mouth every 8  (eight) hours as needed for nausea or vomiting., Disp: 20 tablet, Rfl: 0 .  oseltamivir (TAMIFLU) 75 MG capsule, Take 1 capsule (75 mg total) by mouth every 12 (twelve) hours., Disp: 10 capsule, Rfl: 0 .  oxyCODONE-acetaminophen (PERCOCET) 10-325 MG tablet, Take 1 tablet by mouth every 4 (four) hours as needed for pain., Disp: 20 tablet, Rfl: 0 .  traMADol (ULTRAM) 50 MG tablet, Take 1 tablet (50 mg total) by mouth every 8 (eight) hours as needed., Disp: 30 tablet, Rfl: 0 .  traZODone (DESYREL) 100 MG tablet, TAKE 1 TABLET BY MOUTH DAILY AS NEEDED AT BEDTIME., Disp: , Rfl:   Social History   Tobacco Use  Smoking Status Current Some Day Smoker  . Years: 15.00  . Types: Cigarettes  Smokeless Tobacco Never Used    Allergies  Allergen Reactions  . Penicillins Rash    Has patient had a PCN reaction causing immediate rash, facial/tongue/throat swelling, SOB or lightheadedness with hypotension: No Has patient had a PCN reaction causing severe rash involving mucus membranes or skin necrosis: yes mouth Has patient had a PCN reaction that required hospitalization No Has patient had a PCN reaction occurring within the last 10 years: Yes 5 or 6 years ago If all of the above answers are "NO", then may proceed with Cephalosporin use.   . Tuberculin Tests Itching, Palpitations and Other (See Comments)    Reaction:  Made pts mouth feel really cold   Objective:  There were no vitals filed for this visit. There is no height or weight on file to calculate BMI. Constitutional Well developed. Well nourished.  Vascular Foot warm and well perfused. Capillary refill normal to all digits.   Neurologic Normal speech. Oriented to person, place, and time. Epicritic sensation to light touch grossly present bilaterally.  Dermatologic Skin healing well without signs of infection. Skin edges well coapted without signs of infection.  Orthopedic: Tenderness to palpation noted about the surgical site.    Radiographs: None Assessment:   1. Postoperative pain   2. Plantar fascial fibromatosis of left foot    Plan:  Patient was evaluated and treated and all questions answered.  S/p foot surgery left -Progressing as expected post-operatively. -XR: None -WB Status: NWB with crutches -Sutures: intact. Leave intact for 2 more weeks. -Medications: None refilled -Foot redressed.  Return in about 1 week (around 12/20/2019).

## 2019-12-22 ENCOUNTER — Encounter: Payer: Self-pay | Admitting: Podiatry

## 2019-12-23 NOTE — Progress Notes (Signed)
Subjective:  Patient ID: Tammie Brown, female    DOB: 16-Mar-1981,  MRN: [id number]  No chief complaint on file.   38 y.o. female presents with the above complaint. Would like to discuss surgical intervention for the left foot fibroma.  Review of Systems: Negative except as noted in the HPI. Denies N/V/F/Ch.  Past Medical History:  Diagnosis Date  . Chlamydia   . Gonorrhea   . Headache(784.0)   . Heart murmur   . PID (acute pelvic inflammatory disease)   . Urinary tract infection     Current Outpatient Medications:  .  benzonatate (TESSALON) 100 MG capsule, Take 1 capsule (100 mg total) by mouth every 8 (eight) hours., Disp: 10 capsule, Rfl: 0 .  clindamycin (CLEOCIN) 150 MG capsule, Take 1 capsule (150 mg total) by mouth 2 (two) times daily., Disp: 14 capsule, Rfl: 0 .  doxycycline (VIBRAMYCIN) 100 MG capsule, Take 1 capsule (100 mg total) by mouth 2 (two) times daily. One po bid x 7 days, Disp: 14 capsule, Rfl: 0 .  guaiFENesin (MUCINEX) 600 MG 12 hr tablet, Take 600 mg by mouth 2 (two) times daily., Disp: , Rfl:  .  HYDROcodone-acetaminophen (NORCO) 10-325 MG tablet, Take 1 tablet by mouth every 8 (eight) hours as needed for up to 5 days., Disp: 15 tablet, Rfl: 0 .  HYDROcodone-acetaminophen (NORCO) 5-325 MG tablet, Take 1 tablet by mouth every 6 (six) hours as needed for moderate pain., Disp: 15 tablet, Rfl: 0 .  ibuprofen (ADVIL) 800 MG tablet, Take 1 tablet (800 mg total) by mouth every 8 (eight) hours as needed., Disp: 30 tablet, Rfl: 0 .  methocarbamol (ROBAXIN) 500 MG tablet, Take 1 tablet (500 mg total) by mouth at bedtime as needed for muscle spasms., Disp: 14 tablet, Rfl: 0 .  Multiple Vitamin (MULTI-VITAMIN) tablet, Take by mouth., Disp: , Rfl:  .  ondansetron (ZOFRAN) 4 MG tablet, Take 1 tablet (4 mg total) by mouth every 8 (eight) hours as needed for nausea or vomiting., Disp: 20 tablet, Rfl: 0 .  oseltamivir (TAMIFLU) 75 MG capsule, Take 1 capsule (75 mg total) by  mouth every 12 (twelve) hours., Disp: 10 capsule, Rfl: 0 .  traMADol (ULTRAM) 50 MG tablet, Take 1 tablet (50 mg total) by mouth every 8 (eight) hours as needed., Disp: 30 tablet, Rfl: 0 .  traZODone (DESYREL) 100 MG tablet, TAKE 1 TABLET BY MOUTH DAILY AS NEEDED AT BEDTIME., Disp: , Rfl:   Social History   Tobacco Use  Smoking Status Current Some Day Smoker  . Years: 15.00  . Types: Cigarettes  Smokeless Tobacco Never Used    Allergies  Allergen Reactions  . Penicillins Rash    Has patient had a PCN reaction causing immediate rash, facial/tongue/throat swelling, SOB or lightheadedness with hypotension: No Has patient had a PCN reaction causing severe rash involving mucus membranes or skin necrosis: yes mouth Has patient had a PCN reaction that required hospitalization No Has patient had a PCN reaction occurring within the last 10 years: Yes 5 or 6 years ago If all of the above answers are "NO", then may proceed with Cephalosporin use.   . Tuberculin Tests Itching, Palpitations and Other (See Comments)    Reaction:  Made pts mouth feel really cold   Objective:   There were no vitals filed for this visit. There is no height or weight on file to calculate BMI. Constitutional Well developed. Well nourished.  Vascular Dorsalis pedis pulses palpable bilaterally. Posterior tibial  pulses palpable bilaterally. Capillary refill normal to all digits.  No cyanosis or clubbing noted. Pedal hair growth normal.  Neurologic Normal speech. Oriented to person, place, and time. Epicritic sensation to light touch grossly present bilaterally.  Dermatologic  thick scar area of previous surgical excision with palpable scar tissue  Orthopedic:  Pain palpation about the left medial arch with firm nodule   Radiographs: None Assessment:   1. Plantar fascial fibromatosis   2. Fibroma   3. Left foot pain    Plan:  Patient was evaluated and treated and all questions answered.  Plantar fibroma  left -MRI again reviewed with patient. -Discussed conservative vs conservative therapy with patient. -Patient has failed all conservative therapy and wishes to proceed with surgical intervention. All risks, benefits, and alternatives discussed with patient. No guarantees given. Consent reviewed and signed by patient. -Planned procedures: surgical excision of plantar fibroma, adjacent tissue transfer.  25 minutes of face to face time were spent with the patient. >50% of this was spent on counseling and coordination of care. Specifically discussed with patient the above diagnoses and overall treatment plan.   No follow-ups on file.

## 2019-12-24 ENCOUNTER — Telehealth: Payer: Self-pay

## 2019-12-24 NOTE — Telephone Encounter (Signed)
Patient called, upset about the way her surgery foot was wrapped last week (12/19/2019). She claims that the dressing keeps sliding down and exposes the staples to her boot which then pulls the staples. Advised to re-wrap it the best she could with the ace wrap she has on hand. She is concerned that she has no way to clean the incision site. I asked if she was still in fact non-weight bearing and she said yes. Her next post op is scheduled for 01/03/2020. Advise patient to come by the office today after 1:00 so that I can assess the situation and re-wrap her foot.

## 2019-12-31 ENCOUNTER — Telehealth: Payer: Self-pay | Admitting: Podiatry

## 2019-12-31 NOTE — Telephone Encounter (Signed)
Pt called to see about a refill on pain meds DOS 12/05/2019 TO [facility] on [address]

## 2020-01-03 ENCOUNTER — Other Ambulatory Visit: Payer: Self-pay

## 2020-01-03 ENCOUNTER — Ambulatory Visit (INDEPENDENT_AMBULATORY_CARE_PROVIDER_SITE_OTHER): Payer: Medicaid Other | Admitting: Podiatry

## 2020-01-03 DIAGNOSIS — Z9889 Other specified postprocedural states: Secondary | ICD-10-CM

## 2020-01-03 DIAGNOSIS — M722 Plantar fascial fibromatosis: Secondary | ICD-10-CM

## 2020-01-03 MED ORDER — HYDROCODONE-ACETAMINOPHEN 5-325 MG PO TABS: 1.0000 | ORAL_TABLET | Freq: Four times a day (QID) | ORAL | 0 refills | Status: DC | PRN

## 2020-01-03 NOTE — Progress Notes (Signed)
  Subjective:  Patient ID: Tammie Brown, female    DOB: [birth year],  MRN: [id number]  Chief Complaint  Patient presents with  . Post-op Follow-up    dos 12.09.20 left. pt states of throbbing sensation with itching at surgical site.     DOS: 12/05/2019 Procedure: Excision of plantar fibroma left foot, adjacent tissue transfer  39 y.o. female presents with the above complaint. History confirmed with patient.   Objective:  Physical Exam: tenderness at the surgical site and no edema noted. Incision: healing well, no significant drainage, no dehiscence, no significant erythema  Assessment:  No diagnosis found.  Plan:  Patient was evaluated and treated and all questions answered.  Post-operative State -Dressing applied consisting of sterile gauze, kerlix and ACE bandage -NWB with crutches  -Suture mobile at next visit -Refill pain medication  No follow-ups on file.

## 2020-01-11 ENCOUNTER — Other Ambulatory Visit: Payer: Self-pay

## 2020-01-11 ENCOUNTER — Ambulatory Visit (INDEPENDENT_AMBULATORY_CARE_PROVIDER_SITE_OTHER): Payer: Self-pay | Admitting: Podiatry

## 2020-01-11 DIAGNOSIS — Z9889 Other specified postprocedural states: Secondary | ICD-10-CM

## 2020-01-11 MED ORDER — DIAZEPAM 5 MG PO TABS: 5.0000 mg | ORAL_TABLET | ORAL | 0 refills | Status: DC

## 2020-01-11 NOTE — Progress Notes (Signed)
  Subjective:  Patient ID: Tammie Brown, female    DOB: 1981-08-11,  MRN: [id number]  No chief complaint on file.   DOS: 12/05/2019 Procedure: Removal of plantar fibroma left   39 y.o. female presents with the above complaint. History confirmed with patient. Denies issue. Anxious about having the sutures and staples removed.  Objective:  Physical Exam: tenderness at the surgical site, no edema noted and calf supple, nontender. Incision: healing well, no significant drainage, no dehiscence, no significant erythema  Assessment:   1. Post-operative state    Plan:  Patient was evaluated and treated and all questions answered.  Post-operative State -Sutures removed -Ok to start showering at this time. Advised they cannot soak. -Dressing applied consisting of mepilex border -NWB with crutches -Remove staples next week. Rx valium for anxiety.  Return in about 1 week (around 01/18/2020) for Staple removal .

## 2020-01-15 ENCOUNTER — Telehealth: Payer: Self-pay | Admitting: *Deleted

## 2020-01-15 MED ORDER — HYDROCODONE-ACETAMINOPHEN 5-325 MG PO TABS: 1.0000 | ORAL_TABLET | Freq: Four times a day (QID) | ORAL | 0 refills | Status: DC | PRN

## 2020-01-15 NOTE — Telephone Encounter (Signed)
Pt states she needs a refill of the pain medication for 01/18/2020 for her visit to have her staples out.

## 2020-01-15 NOTE — Telephone Encounter (Signed)
Sent can you inform?

## 2020-01-15 NOTE — Telephone Encounter (Signed)
I informed pt of Dr. March Rummage orders.

## 2020-01-18 ENCOUNTER — Other Ambulatory Visit: Payer: Self-pay

## 2020-01-18 ENCOUNTER — Ambulatory Visit (INDEPENDENT_AMBULATORY_CARE_PROVIDER_SITE_OTHER): Payer: Medicaid Other | Admitting: Podiatry

## 2020-01-18 DIAGNOSIS — Z9889 Other specified postprocedural states: Secondary | ICD-10-CM

## 2020-01-18 DIAGNOSIS — M722 Plantar fascial fibromatosis: Secondary | ICD-10-CM

## 2020-01-18 NOTE — Progress Notes (Signed)
  Subjective:  Patient ID: Tammie Brown, female    DOB: 1981/10/07,  MRN: [id number]  No chief complaint on file.  DOS: 12/05/2019 Procedure: Removal of plantar fibroma left   39 y.o. female presents with the above complaint. History confirmed with patient. Here for staple removal, took pain medication prior to visit.  Objective:  Physical Exam: tenderness at the surgical site, no edema noted and calf supple, nontender. Incision: healing well, no significant drainage, no dehiscence, no significant erythema  Assessment:   1. Post-operative state   2. Plantar fascial fibromatosis of left foot    Plan:  Patient was evaluated and treated and all questions answered.  Post-operative State -Staples removed today after application of lidocaine/prilocaine cream. Steris applied. -Start WB in the boot tomorrow. -F/u in 2 weeks.  No follow-ups on file.

## 2020-01-21 ENCOUNTER — Telehealth: Payer: Self-pay | Admitting: *Deleted

## 2020-01-21 ENCOUNTER — Telehealth: Payer: Self-pay | Admitting: Podiatry

## 2020-01-21 MED ORDER — HYDROCODONE-ACETAMINOPHEN 5-325 MG PO TABS: 1.0000 | ORAL_TABLET | Freq: Four times a day (QID) | ORAL | 0 refills | Status: DC | PRN

## 2020-01-21 NOTE — Telephone Encounter (Signed)
Pt states she forgot to ask for a refill of the pain medication at Friday's visit.

## 2020-01-21 NOTE — Addendum Note (Signed)
Addended by: Hardie Pulley on: 01/21/2020 04:16 PM   Modules accepted: Orders

## 2020-01-21 NOTE — Telephone Encounter (Signed)
Pt called requesting refill of Hydrocodone, please send to [facility] on [address]

## 2020-01-21 NOTE — Telephone Encounter (Signed)
Dr. March Rummage sent pt's pain medication to the [facility] and I informed pt.

## 2020-01-29 ENCOUNTER — Other Ambulatory Visit: Payer: Self-pay | Admitting: Podiatry

## 2020-01-29 NOTE — Telephone Encounter (Signed)
Hey, Please help the patient with this. Thanks Lattie Haw

## 2020-01-29 NOTE — Telephone Encounter (Signed)
Pt called requesting refill of hydrocodone. Please send to [facility] on [address]

## 2020-01-30 MED ORDER — HYDROCODONE-ACETAMINOPHEN 5-325 MG PO TABS: 1.0000 | ORAL_TABLET | Freq: Four times a day (QID) | ORAL | 0 refills | Status: DC | PRN

## 2020-01-30 NOTE — Addendum Note (Signed)
Addended by: Hardie Pulley on: 01/30/2020 04:21 PM   Modules accepted: Orders

## 2020-01-30 NOTE — Telephone Encounter (Signed)
Pt called to follow up on the medication refill

## 2020-02-01 ENCOUNTER — Ambulatory Visit (INDEPENDENT_AMBULATORY_CARE_PROVIDER_SITE_OTHER): Payer: Medicaid Other | Admitting: Podiatry

## 2020-02-01 ENCOUNTER — Other Ambulatory Visit: Payer: Self-pay

## 2020-02-01 DIAGNOSIS — Z9889 Other specified postprocedural states: Secondary | ICD-10-CM

## 2020-02-01 DIAGNOSIS — M722 Plantar fascial fibromatosis: Secondary | ICD-10-CM | POA: Diagnosis not present

## 2020-02-01 NOTE — Progress Notes (Signed)
  Subjective:  Patient ID: Tammie Brown, female    DOB: 1981-12-22,  MRN: [id number]  Chief Complaint  Patient presents with  . Routine Post Op    DOS 12.09.2020 EXC. BENIGN LESION 1.0CM AND PLANTAR FIBROMA LT. Pt states healing well with no concerns.   DOS: 12/05/2019 Procedure: Removal of plantar fibroma left   39 y.o. female presents with the above complaint. History confirmed with patient.    Objective:  Physical Exam: no tenderness at the surgical site, no edema noted and calf supple, nontender. Incision: almost fully healed, slight scabbing.  Assessment:   1. Post-operative state   2. Plantar fascial fibromatosis of left foot    Plan:  Patient was evaluated and treated and all questions answered.   Post-operative State -Wound almost fully healed still with some scabbing that should resolve with time. -Transition to normal shoegear as tolerated. Surgical shoe dispensed. Transition as tolerated.  Return in about 4 weeks (around 02/29/2020) for Post-Op (No XRs).

## 2020-02-27 ENCOUNTER — Ambulatory Visit (INDEPENDENT_AMBULATORY_CARE_PROVIDER_SITE_OTHER): Payer: Medicaid Other | Admitting: Podiatry

## 2020-02-27 DIAGNOSIS — Z5329 Procedure and treatment not carried out because of patient's decision for other reasons: Secondary | ICD-10-CM

## 2020-02-27 NOTE — Progress Notes (Signed)
No show for appt. 

## 2020-02-29 ENCOUNTER — Encounter: Payer: Medicaid Other | Admitting: Podiatry

## 2020-04-05 ENCOUNTER — Encounter (HOSPITAL_COMMUNITY): Payer: Self-pay

## 2020-04-05 ENCOUNTER — Emergency Department (HOSPITAL_COMMUNITY)
Admission: EM | Admit: 2020-04-05 | Discharge: 2020-04-05 | Disposition: A | Discharge status: Home / Self Care | Payer: Medicaid Other | Attending: Emergency Medicine | Admitting: Emergency Medicine

## 2020-04-05 ENCOUNTER — Other Ambulatory Visit: Payer: Self-pay

## 2020-04-05 DIAGNOSIS — Z79899 Other long term (current) drug therapy: Secondary | ICD-10-CM | POA: Insufficient documentation

## 2020-04-05 DIAGNOSIS — R070 Pain in throat: Secondary | ICD-10-CM | POA: Diagnosis present

## 2020-04-05 DIAGNOSIS — J029 Acute pharyngitis, unspecified: Secondary | ICD-10-CM | POA: Diagnosis not present

## 2020-04-05 DIAGNOSIS — F17210 Nicotine dependence, cigarettes, uncomplicated: Secondary | ICD-10-CM | POA: Insufficient documentation

## 2020-04-05 LAB — GROUP A STREP BY PCR: Group A Strep by PCR: NOT DETECTED

## 2020-04-05 MED ORDER — FEXOFENADINE-PSEUDOEPHED ER 60-120 MG PO TB12: 1.0000 | ORAL_TABLET | Freq: Two times a day (BID) | ORAL | 0 refills | Status: AC

## 2020-04-05 MED ORDER — KETOROLAC TROMETHAMINE 30 MG/ML IJ SOLN
60.0000 mg | Freq: Once | INTRAMUSCULAR | Status: AC
  Administered 2020-04-05: 60 mg via INTRAMUSCULAR
  Filled 2020-04-05: qty 2

## 2020-04-05 MED ORDER — DEXAMETHASONE 10 MG/ML FOR PEDIATRIC ORAL USE
10.0000 mg | Freq: Once | INTRAMUSCULAR | Status: AC
  Administered 2020-04-05: 10 mg via ORAL
  Filled 2020-04-05: qty 1

## 2020-04-05 NOTE — ED Provider Notes (Signed)
[facility] DEPT Provider Note   CSN: [id number] Arrival date & time: 04/05/20  0049     History Chief Complaint  Patient presents with  . Sore Throat    Tammie Brown is a 39 y.o. female.  Patient presents to the emergency department for evaluation of sore throat and ear pain.  Symptoms began today.  Patient reports that there is constant pain on the right side of the throat that goes up into the ear, worsens when she talks or swallows.  No associated fever.  No other URI symptoms.  Has used cough drops and salt water without improvement.        Past Medical History:  Diagnosis Date  . Chlamydia   . Gonorrhea   . Headache(784.0)   . Heart murmur   . PID (acute pelvic inflammatory disease)   . Urinary tract infection     Patient Active Problem List   Diagnosis Date Noted  . Smoker 04/08/2014  . Contraception management 04/08/2014  . Gonorrhea 08/17/2012  . 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 APPENDECTOMY    . CESAREAN SECTION    . CESAREAN SECTION  2001 & 2009  . FOOT SURGERY Left      OB History    Gravida  2   Para  2   Term  2   Preterm      AB      Living  2     SAB      TAB      Ectopic      Multiple      Live Births  1           Family History  Problem Relation Age of Onset  . Asthma Mother   . Hypertension Mother   . Diabetes Father   . Cancer Maternal Grandfather        throat  . Other Neg Hx   . Hearing loss Neg Hx     Social History   Tobacco Use  . Smoking status: Current Some Day Smoker    Years: 15.00    Types: Cigarettes  . Smokeless tobacco: Never Used  Substance Use Topics  . Alcohol use: Yes    Comment: occassional few times a month  LAST DRANK- FRIDAY-  BEER AND LIQUOR  . Drug use: Yes    Frequency: 2.0 times per week    Types: Marijuana    Home Medications Prior to Admission medications   Medication Sig  Start Date End Date Taking? Authorizing Provider  benzonatate (TESSALON) 100 MG capsule Take 1 capsule (100 mg total) by mouth every 8 (eight) hours. 12/14/18   Drenda Freeze, MD  clindamycin (CLEOCIN) 150 MG capsule Take 1 capsule (150 mg total) by mouth 2 (two) times daily. 12/05/19   Evelina Bucy, DPM  diazepam (VALIUM) 5 MG tablet Take 1 tablet (5 mg total) by mouth 30 (thirty) minutes before procedure. Repeat once if needed. 01/11/20   Evelina Bucy, DPM  doxycycline (VIBRAMYCIN) 100 MG capsule Take 1 capsule (100 mg total) by mouth 2 (two) times daily. One po bid x 7 days 12/14/18   Drenda Freeze, MD  fexofenadine-pseudoephedrine (ALLEGRA-D) 60-120 MG 12 hr tablet Take 1 tablet by mouth every 12 (twelve) hours. 04/05/20   Orpah Greek, MD  guaiFENesin (MUCINEX) 600 MG 12 hr tablet Take 600 mg by mouth 2 (two) times daily.  [provider]  HYDROcodone-acetaminophen (NORCO) 5-325 MG tablet Take 1 tablet by mouth every 6 (six) hours as needed for moderate pain. 01/30/20   Evelina Bucy, DPM  ibuprofen (ADVIL) 800 MG tablet Take 1 tablet (800 mg total) by mouth every 8 (eight) hours as needed. 07/13/19   Evelina Bucy, DPM  methocarbamol (ROBAXIN) 500 MG tablet Take 1 tablet (500 mg total) by mouth at bedtime as needed for muscle spasms. 11/02/18   Caccavale, Sophia, PA-C  Multiple Vitamin (MULTI-VITAMIN) tablet Take by mouth.    [provider]  ondansetron (ZOFRAN) 4 MG tablet Take 1 tablet (4 mg total) by mouth every 8 (eight) hours as needed for nausea or vomiting. 12/05/19   Evelina Bucy, DPM  oseltamivir (TAMIFLU) 75 MG capsule Take 1 capsule (75 mg total) by mouth every 12 (twelve) hours. 01/15/19   Lacretia Leigh, MD  traMADol (ULTRAM) 50 MG tablet Take 1 tablet (50 mg total) by mouth every 8 (eight) hours as needed. 08/29/18   Evelina Bucy, DPM  traZODone (DESYREL) 100 MG tablet TAKE 1 TABLET BY MOUTH DAILY AS NEEDED AT BEDTIME. 08/24/19    [provider]    Allergies    Penicillins and Tuberculin tests  Review of Systems   Review of Systems  HENT: Positive for ear pain and sore throat.   All other systems reviewed and are negative.   Physical Exam Updated Vital Signs BP 139/81 (BP Location: Left Arm)   Pulse 70   Temp 97.9 F (36.6 C) (Oral)   Resp 18   SpO2 100%   Physical Exam Vitals and nursing note reviewed.  Constitutional:      General: She is not in acute distress.    Appearance: Normal appearance. She is well-developed.  HENT:     Head: Normocephalic and atraumatic.     Right Ear: Hearing, tympanic membrane and ear canal normal.     Left Ear: Hearing normal.     Nose: Nose normal.     Mouth/Throat:     Pharynx: Posterior oropharyngeal erythema present.     Tonsils: No tonsillar exudate or tonsillar abscesses.  Eyes:     Conjunctiva/sclera: Conjunctivae normal.     Pupils: Pupils are equal, round, and reactive to light.  Cardiovascular:     Rate and Rhythm: Regular rhythm.     Heart sounds: S1 normal and S2 normal. No murmur. No friction rub. No gallop.   Pulmonary:     Effort: Pulmonary effort is normal. No respiratory distress.     Breath sounds: Normal breath sounds.  Chest:     Chest wall: No tenderness.  Abdominal:     General: Bowel sounds are normal.     Palpations: Abdomen is soft.     Tenderness: There is no abdominal tenderness. There is no guarding or rebound. Negative signs include Murphy's sign and McBurney's sign.     Hernia: No hernia is present.  Musculoskeletal:        General: Normal range of motion.     Cervical back: Normal range of motion and neck supple.  Skin:    General: Skin is warm and dry.     Findings: No rash.  Neurological:     Mental Status: She is alert and oriented to person, place, and time.     GCS: GCS eye subscore is 4. GCS verbal subscore is 5. GCS motor subscore is 6.     Cranial Nerves: No cranial nerve deficit.  Sensory: No sensory  deficit.     Coordination: Coordination normal.  Psychiatric:        Speech: Speech normal.        Behavior: Behavior normal.        Thought Content: Thought content normal.     ED Results / Procedures / Treatments   Labs (all labs ordered are listed, but only abnormal results are displayed) Labs Reviewed  GROUP A STREP BY PCR    EKG None  Radiology No results found.  Procedures Procedures (including critical care time)  Medications Ordered in ED Medications  ketorolac (TORADOL) 30 MG/ML injection 60 mg (has no administration in time range)  dexamethasone (DECADRON) 10 MG/ML injection for Pediatric ORAL use 10 mg (10 mg Oral Given 04/05/20 0139)    ED Course  I have reviewed the triage vital signs and the nursing notes.  Pertinent labs & imaging results that were available during my care of the patient were reviewed by me and considered in my medical decision making (see chart for details).    MDM Rules/Calculators/A&P                      Patient presents to the emergency department for evaluation of right-sided sore throat with pain into the right ear.  Right ear is normal including tabetic membrane.  Oropharyngeal examination reveals very mild erythema but no swelling, no tonsillar enlargement or exudate.  No sign of tonsillar abscess.  Strep is negative.  Final Clinical Impression(s) / ED Diagnoses Final diagnoses:  Pharyngitis, unspecified etiology    Rx / DC Orders ED Discharge Orders         Ordered    fexofenadine-pseudoephedrine (ALLEGRA-D) 60-120 MG 12 hr tablet  Every 12 hours     04/05/20 0221           Orpah Greek, MD 04/05/20 0222

## 2020-04-05 NOTE — ED Triage Notes (Signed)
Patient arrived stating that she is having right ear and throat pain that started today. Patient reports taking cough drops and gargling salt water with no relief.

## 2020-05-15 ENCOUNTER — Ambulatory Visit: Payer: Medicaid Other | Admitting: Podiatry

## 2020-05-15 ENCOUNTER — Ambulatory Visit (INDEPENDENT_AMBULATORY_CARE_PROVIDER_SITE_OTHER): Payer: Medicaid Other

## 2020-05-15 ENCOUNTER — Other Ambulatory Visit: Payer: Self-pay

## 2020-05-15 DIAGNOSIS — M722 Plantar fascial fibromatosis: Secondary | ICD-10-CM

## 2020-05-15 DIAGNOSIS — D219 Benign neoplasm of connective and other soft tissue, unspecified: Secondary | ICD-10-CM

## 2020-05-15 DIAGNOSIS — F17200 Nicotine dependence, unspecified, uncomplicated: Secondary | ICD-10-CM

## 2020-05-15 DIAGNOSIS — M79672 Pain in left foot: Secondary | ICD-10-CM

## 2020-05-15 MED ORDER — HYDROCODONE-ACETAMINOPHEN 5-325 MG PO TABS: 1.0000 | ORAL_TABLET | Freq: Four times a day (QID) | ORAL | 0 refills | Status: DC | PRN

## 2020-05-15 NOTE — Progress Notes (Signed)
  Subjective:  Patient ID: Tammie Brown, female    DOB: 1981-02-27,  MRN: [id number]  Chief Complaint  Patient presents with  . Foot Problem    Pt states left plantar fibroma has returned, painful at all times even at rest, 2 week duration. No known injuries.    38 y.o. female presents with the above complaint. History confirmed with patient.   Objective:  Physical Exam: warm, good capillary refill, no trophic changes or ulcerative lesions, normal DP and PT pulses and normal sensory exam. Left Foot: palpable nodule medial arch with POP. approx 1cm in diameter.    No images are attached to the encounter.  Radiographs: X-ray of the left foot: no fracture, dislocation, swelling or degenerative changes noted Assessment:   1. Plantar fascial fibromatosis of left foot   2. Left foot pain   3. Fibroma   4. Smoker      Plan:  Patient was evaluated and treated and all questions answered.  Plantar Fibroma left -Plan for surgical excision. She has failed attempted removal x2 with recurrence. Plan for wider excision and for possible placement of amniotic graft  -Higher risk of surgery given  -Order MRI for pre-op planning  Return for post-op care.

## 2020-05-15 NOTE — Patient Instructions (Signed)
Pre-Operative Instructions  Congratulations, you have decided to take an important step towards improving your quality of life.  You can be assured that the doctors and staff at [facility] will be with you every step of the way.  Here are some important things you should know:  1. Plan to be at the surgery center/hospital at least 1 (one) hour prior to your scheduled time, unless otherwise directed by the surgical center/hospital staff.  You must have a responsible adult accompany you, remain during the surgery and drive you home.  Make sure you have directions to the surgical center/hospital to ensure you arrive on time. 2. If you are having surgery at [facility] or  hospitals, you will need a copy of your medical history and physical form from your family physician within one month prior to the date of surgery. We will give you a form for your primary physician to complete.  3. We make every effort to accommodate the date you request for surgery.  However, there are times where surgery dates or times have to be moved.  We will contact you as soon as possible if a change in schedule is required.   4. No aspirin/ibuprofen for one week before surgery.  If you are on aspirin, any non-steroidal anti-inflammatory medications (Mobic, Aleve, Ibuprofen) should not be taken seven (7) days prior to your surgery.  You make take Tylenol for pain prior to surgery.  5. Medications - If you are taking daily heart and blood pressure medications, seizure, reflux, allergy, asthma, anxiety, pain or diabetes medications, make sure you notify the surgery center/hospital before the day of surgery so they can tell you which medications you should take or avoid the day of surgery. 6. No food or drink after midnight the night before surgery unless directed otherwise by surgical center/hospital staff. 7. No alcoholic beverages 24-hours prior to surgery.  No smoking 24-hours prior or 24-hours after  surgery. 8. Wear loose pants or shorts. They should be loose enough to fit over bandages, boots, and casts. 9. Don't wear slip-on shoes. Sneakers are preferred. 10. Bring your boot with you to the surgery center/hospital.  Also bring crutches or a walker if your physician has prescribed it for you.  If you do not have this equipment, it will be provided for you after surgery. 11. If you have not been contacted by the surgery center/hospital by the day before your surgery, call to confirm the date and time of your surgery. 12. Leave-time from work may vary depending on the type of surgery you have.  Appropriate arrangements should be made prior to surgery with your employer. 13. Prescriptions will be provided immediately following surgery by your doctor.  Fill these as soon as possible after surgery and take the medication as directed. Pain medications will not be refilled on weekends and must be approved by the doctor. 14. Remove nail polish on the operative foot and avoid getting pedicures prior to surgery. 15. Wash the night before surgery.  The night before surgery wash the foot and leg well with water and the antibacterial soap provided. Be sure to pay special attention to beneath the toenails and in between the toes.  Wash for at least three (3) minutes. Rinse thoroughly with water and dry well with a towel.  Perform this wash unless told not to do so by your physician.  Enclosed: 1 Ice pack (please put in freezer the night before surgery)   1 Hibiclens skin cleaner     Pre-op instructions  If you have any questions regarding the instructions, please do not hesitate to call our office.  [place]: [address], [place], [place] [address] -- [phone]  [place]: [address] [place], [place] [address] -- [phone]  [place]: [address], [place],  [address] -- [phone]   Website: [email or web address] 

## 2020-05-16 ENCOUNTER — Telehealth: Payer: Self-pay | Admitting: *Deleted

## 2020-05-16 DIAGNOSIS — M79672 Pain in left foot: Secondary | ICD-10-CM

## 2020-05-16 DIAGNOSIS — M722 Plantar fascial fibromatosis: Secondary | ICD-10-CM

## 2020-05-16 NOTE — Telephone Encounter (Signed)
Prepared orders and demographics to be pre-certed and faxed to [facility], once 05/15/2020 clinicals have measurements noted.

## 2020-05-16 NOTE — Telephone Encounter (Signed)
-----   Message from Evelina Bucy, DPM sent at 05/15/2020  3:27 PM EDT ----- Can we order MRI left foot - recurrent plantar fibroma

## 2020-05-19 NOTE — Telephone Encounter (Signed)
Size documented in chart

## 2020-05-27 NOTE — Telephone Encounter (Signed)
[facility] - MEDICAID AUTHORIZATION:  [id number] FOR LEFT MRI [id number], VALID: 05/19/2020 END: 11/15/2020 FOR CASE:  [id number]. Faxed to [facility].

## 2020-06-01 ENCOUNTER — Ambulatory Visit
Admission: RE | Admit: 2020-06-01 | Discharge: 2020-06-01 | Disposition: A | Discharge status: Home / Self Care | Payer: Medicaid Other | Source: Ambulatory Visit | Attending: Podiatry | Admitting: Podiatry

## 2020-06-01 MED ORDER — GADOBENATE DIMEGLUMINE 529 MG/ML IV SOLN
18.0000 mL | Freq: Once | INTRAVENOUS | Status: AC | PRN
  Administered 2020-06-01: 18 mL via INTRAVENOUS

## 2020-06-10 ENCOUNTER — Encounter (HOSPITAL_BASED_OUTPATIENT_CLINIC_OR_DEPARTMENT_OTHER): Payer: Self-pay | Admitting: Podiatry

## 2020-06-10 ENCOUNTER — Other Ambulatory Visit: Payer: Self-pay

## 2020-06-10 NOTE — Progress Notes (Addendum)
Spoke w/ via phone for pre-op interview---patient Lab needs dos----   Urine poct        COVID test ------06-16-1099 Arrive at -------530 am 06-18-2020 NPO after ------midnight Medications to take morning of surgery -----pt wishes to take no meds am of surgery Diabetic medication -----n/a Patient Special Instructions -----none Pre-Op special Istructions -----none Patient verbalized understanding of instructions that were given at this phone interview. Patient denies shortness of breath, chest pain, fever, cough a this phone interview.  H and P received from [facility] and placed on chart.

## 2020-06-14 ENCOUNTER — Other Ambulatory Visit (HOSPITAL_COMMUNITY): Payer: Medicaid Other

## 2020-06-16 ENCOUNTER — Other Ambulatory Visit (HOSPITAL_COMMUNITY): Payer: Medicaid Other

## 2020-06-16 ENCOUNTER — Other Ambulatory Visit: Payer: Medicaid Other

## 2020-06-17 ENCOUNTER — Other Ambulatory Visit (HOSPITAL_COMMUNITY)
Admission: RE | Admit: 2020-06-17 | Discharge: 2020-06-17 | Disposition: A | Discharge status: Home / Self Care | Payer: Medicaid Other | Source: Ambulatory Visit | Attending: Podiatry | Admitting: Podiatry

## 2020-06-17 DIAGNOSIS — Z01812 Encounter for preprocedural laboratory examination: Secondary | ICD-10-CM | POA: Diagnosis present

## 2020-06-17 DIAGNOSIS — Z20822 Contact with and (suspected) exposure to covid-19: Secondary | ICD-10-CM | POA: Insufficient documentation

## 2020-06-17 LAB — SARS CORONAVIRUS 2 (TAT 6-24 HRS): SARS Coronavirus 2: NEGATIVE

## 2020-06-17 NOTE — Progress Notes (Signed)
H and P received again from [facility] and placed on chart for 06-18-2020 surgery

## 2020-06-17 NOTE — Progress Notes (Signed)
Spoke with patient by phone pt aware arrive 745 am 06-18-2020 [facility]

## 2020-06-18 ENCOUNTER — Other Ambulatory Visit: Payer: Self-pay

## 2020-06-18 ENCOUNTER — Ambulatory Visit (HOSPITAL_BASED_OUTPATIENT_CLINIC_OR_DEPARTMENT_OTHER)
Admission: RE | Admit: 2020-06-18 | Discharge: 2020-06-18 | Disposition: A | Discharge status: Home / Self Care | Payer: Medicaid Other | Attending: Podiatry | Admitting: Podiatry

## 2020-06-18 ENCOUNTER — Encounter (HOSPITAL_BASED_OUTPATIENT_CLINIC_OR_DEPARTMENT_OTHER)
Admission: RE | Disposition: A | Discharge status: Home / Self Care | Payer: Self-pay | Source: Home / Self Care | Attending: Podiatry

## 2020-06-18 ENCOUNTER — Ambulatory Visit (HOSPITAL_BASED_OUTPATIENT_CLINIC_OR_DEPARTMENT_OTHER): Payer: Medicaid Other | Admitting: Certified Registered"

## 2020-06-18 ENCOUNTER — Encounter (HOSPITAL_BASED_OUTPATIENT_CLINIC_OR_DEPARTMENT_OTHER): Payer: Self-pay | Admitting: Podiatry

## 2020-06-18 DIAGNOSIS — M722 Plantar fascial fibromatosis: Secondary | ICD-10-CM | POA: Diagnosis not present

## 2020-06-18 DIAGNOSIS — F17200 Nicotine dependence, unspecified, uncomplicated: Secondary | ICD-10-CM | POA: Insufficient documentation

## 2020-06-18 HISTORY — PX: FASCIECTOMY: SHX6525

## 2020-06-18 HISTORY — PX: GRAFT APPLICATION: SHX6696

## 2020-06-18 LAB — POCT PREGNANCY, URINE: Preg Test, Ur: NEGATIVE

## 2020-06-18 SURGERY — FASCIECTOMY, PALM
Anesthesia: General | Site: Foot | Laterality: Left

## 2020-06-18 SURGERY — Surgical Case
Anesthesia: *Unknown

## 2020-06-18 MED ORDER — KETOROLAC TROMETHAMINE 30 MG/ML IJ SOLN
30.0000 mg | Freq: Once | INTRAMUSCULAR | Status: AC
  Administered 2020-06-18: 30 mg via INTRAVENOUS

## 2020-06-18 MED ORDER — HYDROMORPHONE HCL 1 MG/ML IJ SOLN
INTRAMUSCULAR | Status: AC
  Filled 2020-06-18: qty 1

## 2020-06-18 MED ORDER — ONDANSETRON HCL 4 MG/2ML IJ SOLN
INTRAMUSCULAR | Status: AC
  Filled 2020-06-18: qty 2

## 2020-06-18 MED ORDER — HYDROMORPHONE HCL 1 MG/ML IJ SOLN
0.2500 mg | INTRAMUSCULAR | Status: DC | PRN
  Administered 2020-06-18 (×6): 0.5 mg via INTRAVENOUS

## 2020-06-18 MED ORDER — DEXAMETHASONE SODIUM PHOSPHATE 10 MG/ML IJ SOLN
INTRAMUSCULAR | Status: DC | PRN
  Administered 2020-06-18: 4 mg via INTRAVENOUS

## 2020-06-18 MED ORDER — BUPIVACAINE HCL (PF) 0.5 % IJ SOLN
INTRAMUSCULAR | Status: DC | PRN
  Administered 2020-06-18: 5 mL
  Administered 2020-06-18: 10 mL

## 2020-06-18 MED ORDER — KETOROLAC TROMETHAMINE 30 MG/ML IJ SOLN
INTRAMUSCULAR | Status: AC
  Filled 2020-06-18: qty 1

## 2020-06-18 MED ORDER — OXYCODONE-ACETAMINOPHEN 10-325 MG PO TABS: 1.0000 | ORAL_TABLET | ORAL | 0 refills | Status: DC | PRN

## 2020-06-18 MED ORDER — CLINDAMYCIN PHOSPHATE 900 MG/50ML IV SOLN
900.0000 mg | INTRAVENOUS | Status: AC
  Administered 2020-06-18: 900 mg via INTRAVENOUS

## 2020-06-18 MED ORDER — PROPOFOL 10 MG/ML IV BOLUS
INTRAVENOUS | Status: AC
  Filled 2020-06-18: qty 20

## 2020-06-18 MED ORDER — HYDROMORPHONE HCL 1 MG/ML IJ SOLN: 1.0000 mg | INTRAMUSCULAR | Status: AC

## 2020-06-18 MED ORDER — PROPOFOL 10 MG/ML IV BOLUS
INTRAVENOUS | Status: DC | PRN
  Administered 2020-06-18: 50 mg via INTRAVENOUS
  Administered 2020-06-18: 150 mg via INTRAVENOUS

## 2020-06-18 MED ORDER — CLINDAMYCIN PHOSPHATE 900 MG/50ML IV SOLN
INTRAVENOUS | Status: AC
  Filled 2020-06-18: qty 50

## 2020-06-18 MED ORDER — CLINDAMYCIN HCL 150 MG PO CAPS
150.0000 mg | ORAL_CAPSULE | Freq: Two times a day (BID) | ORAL | 0 refills | Status: DC
Start: 2020-06-18 — End: 2020-07-17

## 2020-06-18 MED ORDER — LACTATED RINGERS IV SOLN: INTRAVENOUS | Status: DC

## 2020-06-18 MED ORDER — FENTANYL CITRATE (PF) 100 MCG/2ML IJ SOLN
INTRAMUSCULAR | Status: AC
  Filled 2020-06-18: qty 2

## 2020-06-18 MED ORDER — ONDANSETRON HCL 4 MG/2ML IJ SOLN: 4.0000 mg | Freq: Once | INTRAMUSCULAR | Status: DC | PRN

## 2020-06-18 MED ORDER — MEPERIDINE HCL 25 MG/ML IJ SOLN: 6.2500 mg | INTRAMUSCULAR | Status: DC | PRN

## 2020-06-18 MED ORDER — LIDOCAINE 2% (20 MG/ML) 5 ML SYRINGE
INTRAMUSCULAR | Status: DC | PRN
  Administered 2020-06-18: 100 mg via INTRAVENOUS

## 2020-06-18 MED ORDER — MIDAZOLAM HCL 5 MG/5ML IJ SOLN
INTRAMUSCULAR | Status: DC | PRN
  Administered 2020-06-18: 2 mg via INTRAVENOUS

## 2020-06-18 MED ORDER — MIDAZOLAM HCL 2 MG/2ML IJ SOLN
INTRAMUSCULAR | Status: AC
  Filled 2020-06-18: qty 2

## 2020-06-18 MED ORDER — LIDOCAINE 2% (20 MG/ML) 5 ML SYRINGE
INTRAMUSCULAR | Status: AC
  Filled 2020-06-18: qty 5

## 2020-06-18 MED ORDER — DEXAMETHASONE SODIUM PHOSPHATE 10 MG/ML IJ SOLN
INTRAMUSCULAR | Status: AC
  Filled 2020-06-18: qty 1

## 2020-06-18 MED ORDER — ONDANSETRON HCL 4 MG/2ML IJ SOLN
INTRAMUSCULAR | Status: DC | PRN
  Administered 2020-06-18: 4 mg via INTRAVENOUS

## 2020-06-18 MED ORDER — FENTANYL CITRATE (PF) 100 MCG/2ML IJ SOLN
INTRAMUSCULAR | Status: DC | PRN
  Administered 2020-06-18 (×2): 50 ug via INTRAVENOUS

## 2020-06-18 SURGICAL SUPPLY — 58 items
APL PRP STRL LF DISP 70% ISPRP (MISCELLANEOUS) ×1
BLADE SURG 15 STRL LF DISP TIS (BLADE) ×1 IMPLANT
BLADE SURG 15 STRL SS (BLADE) ×3
BNDG CMPR 9X4 STRL LF SNTH (GAUZE/BANDAGES/DRESSINGS) ×1
BNDG ELASTIC 3X5.8 VLCR STR LF (GAUZE/BANDAGES/DRESSINGS) ×3 IMPLANT
BNDG ELASTIC 4X5.8 VLCR STR LF (GAUZE/BANDAGES/DRESSINGS) ×3 IMPLANT
BNDG ESMARK 4X9 LF (GAUZE/BANDAGES/DRESSINGS) ×2 IMPLANT
BNDG GAUZE ELAST 4 BULKY (GAUZE/BANDAGES/DRESSINGS) ×3 IMPLANT
CHLORAPREP W/TINT 26 (MISCELLANEOUS) ×2 IMPLANT
CNTNR URN SCR LID CUP LEK RST (MISCELLANEOUS) IMPLANT
CONT SPEC 4OZ STRL OR WHT (MISCELLANEOUS)
COVER BACK TABLE 60X90IN (DRAPES) ×3 IMPLANT
COVER WAND RF STERILE (DRAPES) ×3 IMPLANT
CUFF TOURN SGL QUICK 18X4 (TOURNIQUET CUFF) ×2 IMPLANT
CUFF TOURN SGL QUICK 24 (TOURNIQUET CUFF)
CUFF TRNQT CYL 24X4X16.5-23 (TOURNIQUET CUFF) IMPLANT
DRAPE 3/4 80X56 (DRAPES) ×3 IMPLANT
DRAPE EXTREMITY T 121X128X90 (DISPOSABLE) ×3 IMPLANT
DRAPE SHEET LG 3/4 BI-LAMINATE (DRAPES) ×3 IMPLANT
DRAPE U-SHAPE 47X51 STRL (DRAPES) ×3 IMPLANT
ELECT REM PT RETURN 9FT ADLT (ELECTROSURGICAL) ×3
ELECTRODE REM PT RTRN 9FT ADLT (ELECTROSURGICAL) ×1 IMPLANT
GAUZE SPONGE 4X4 12PLY STRL (GAUZE/BANDAGES/DRESSINGS) ×3 IMPLANT
GAUZE XEROFORM 1X8 LF (GAUZE/BANDAGES/DRESSINGS) ×2 IMPLANT
GLOVE BIO SURGEON STRL SZ7.5 (GLOVE) ×3 IMPLANT
GLOVE BIOGEL PI IND STRL 8 (GLOVE) ×1 IMPLANT
GLOVE BIOGEL PI INDICATOR 8 (GLOVE) ×2
GOWN STRL REUS W/ TWL XL LVL3 (GOWN DISPOSABLE) ×1 IMPLANT
GOWN STRL REUS W/TWL XL LVL3 (GOWN DISPOSABLE) ×3
MANIFOLD NEPTUNE II (INSTRUMENTS) ×3 IMPLANT
NDL HYPO 25X1 1.5 SAFETY (NEEDLE) ×1 IMPLANT
NEEDLE HYPO 22GX1.5 SAFETY (NEEDLE) ×2 IMPLANT
NEEDLE HYPO 25X1 1.5 SAFETY (NEEDLE) ×3 IMPLANT
NS IRRIG 1000ML POUR BTL (IV SOLUTION) ×2 IMPLANT
PADDING CAST ABS 4INX4YD NS (CAST SUPPLIES) ×2
PADDING CAST ABS COTTON 4X4 ST (CAST SUPPLIES) ×1 IMPLANT
PENCIL SMOKE EVAC W/HOLSTER (ELECTROSURGICAL) ×3 IMPLANT
SET BASIN DAY SURGERY F.S. (CUSTOM PROCEDURE TRAY) ×3 IMPLANT
SET IRRIG Y TYPE TUR BLADDER L (SET/KITS/TRAYS/PACK) IMPLANT
SPONGE LAP 4X18 RFD (DISPOSABLE) ×3 IMPLANT
STAPLER VISISTAT 35W (STAPLE) ×2 IMPLANT
STOCKINETTE 6  STRL (DRAPES) ×3
STOCKINETTE 6 STRL (DRAPES) ×1 IMPLANT
SUCTION FRAZIER HANDLE 10FR (MISCELLANEOUS) ×3
SUCTION TUBE FRAZIER 10FR DISP (MISCELLANEOUS) IMPLANT
SUT ETHILON 3 0 PS 1 (SUTURE) ×2 IMPLANT
SUT ETHILON 4 0 PS 2 18 (SUTURE) IMPLANT
SUT MNCRL AB 3-0 PS2 18 (SUTURE) ×2 IMPLANT
SUT MNCRL AB 4-0 PS2 18 (SUTURE) ×2 IMPLANT
SUT VIC AB 2-0 SH 27 (SUTURE)
SUT VIC AB 2-0 SH 27XBRD (SUTURE) IMPLANT
SYR BULB EAR ULCER 3OZ GRN STR (SYRINGE) ×3 IMPLANT
SYR CONTROL 10ML LL (SYRINGE) ×5 IMPLANT
TISSUE DERMASPAND 5X5 STRL (Orthopedic Graft) ×2 IMPLANT
TRAY DSU PREP LF (CUSTOM PROCEDURE TRAY) ×3 IMPLANT
TUBE IRRIGATION SET MISONIX (TUBING) IMPLANT
UNDERPAD 30X36 HEAVY ABSORB (UNDERPADS AND DIAPERS) ×3 IMPLANT
YANKAUER SUCT BULB TIP NO VENT (SUCTIONS) ×3 IMPLANT

## 2020-06-18 NOTE — Progress Notes (Signed)
Orthopedic Tech Progress Note Patient Details:  Tammie Brown 04-24-1981 [id number]  Ortho Devices Type of Ortho Device: CAM walker Ortho Device/Splint Interventions: Application   Post Interventions Patient Tolerated: Well Instructions Provided: Care of device   [place] Pink 06/18/2020, 11:43 AM

## 2020-06-18 NOTE — Anesthesia Postprocedure Evaluation (Signed)
Anesthesia Post Note  Patient: Tammie Brown  Procedure(s) Performed: EXCISION OF PLANTAR FIBROMA LEFT ARCH, RADICAL RESECTION OF PLANTAR FASCIITIS (Left Foot) GRAFT  APPLICATION (Left Foot)     Patient location during evaluation: PACU Anesthesia Type: General Level of consciousness: awake and alert Pain management: pain level controlled Vital Signs Assessment: post-procedure vital signs reviewed and stable Respiratory status: spontaneous breathing, nonlabored ventilation, respiratory function stable and patient connected to nasal cannula oxygen Cardiovascular status: blood pressure returned to baseline and stable Postop Assessment: no apparent nausea or vomiting Anesthetic complications: no   No complications documented.  Last Vitals:  Vitals:   06/18/20 1155 06/18/20 1230  BP: 124/60 (!) 135/91  Pulse: 68 64  Resp: 20 16  Temp: 36.4 C   SpO2: 100% 100%    Last Pain:  Vitals:   06/18/20 1230  PainSc: 8                  Katerra Ingman DAVID

## 2020-06-18 NOTE — Anesthesia Procedure Notes (Signed)
Procedure Name: LMA Insertion Date/Time: 06/18/2020 9:20 AM Performed by: Myna Bright, CRNA Pre-anesthesia Checklist: Patient identified, Emergency Drugs available, Suction available and Patient being monitored Patient Re-evaluated:Patient Re-evaluated prior to induction Oxygen Delivery Method: Circle system utilized Preoxygenation: Pre-oxygenation with 100% oxygen Induction Type: IV induction Ventilation: Mask ventilation without difficulty LMA: LMA inserted LMA Size: 4.0 Tube type: Oral Number of attempts: 1 Placement Confirmation: positive ETCO2 and breath sounds checked- equal and bilateral Tube secured with: Tape Dental Injury: Teeth and Oropharynx as per pre-operative assessment

## 2020-06-18 NOTE — Anesthesia Preprocedure Evaluation (Signed)
Anesthesia Evaluation  Patient identified by MRN, date of birth, ID band Patient awake    Reviewed: Allergy & Precautions, NPO status , Patient's Chart, lab work & pertinent test results  Airway Mallampati: II  TM Distance: >3 FB Neck ROM: Full    Dental   Pulmonary Current Smoker,    Pulmonary exam normal        Cardiovascular Normal cardiovascular exam     Neuro/Psych    GI/Hepatic   Endo/Other    Renal/GU      Musculoskeletal   Abdominal   Peds  Hematology   Anesthesia Other Findings   Reproductive/Obstetrics                             Anesthesia Physical Anesthesia Plan  ASA: II  Anesthesia Plan: General   Post-op Pain Management:    Induction: Intravenous  PONV Risk Score and Plan: 2 and Ondansetron and Midazolam  Airway Management Planned: LMA  Additional Equipment:   Intra-op Plan:   Post-operative Plan: Extubation in OR  Informed Consent: I have reviewed the patients History and Physical, chart, labs and discussed the procedure including the risks, benefits and alternatives for the proposed anesthesia with the patient or authorized representative who has indicated his/her understanding and acceptance.       Plan Discussed with: CRNA and Surgeon  Anesthesia Plan Comments:         Anesthesia Quick Evaluation

## 2020-06-18 NOTE — H&P (Signed)
  Subjective:  Patient ID: Tammie Brown, female    DOB: Apr 16, 1981,  MRN: [id number]  No chief complaint on file.   39 y.o. female presents for elective surgery for left foot plantar fibroma  Objective:  Physical Exam: warm, good capillary refill, no trophic changes or ulcerative lesions, normal DP and PT pulses and normal sensory exam. Left Foot: palpable nodule medial arch with POP. approx 1cm in diameter.    Assessment:   Plantar fibroma left  Plan:  Patient was evaluated and treated and all questions answered.  Plantar Fibroma left -To OR today for surgical excision, possible graft application to prevent recurrence. -Consent reviewed and signed. -LLE marked  No follow-ups on file.

## 2020-06-18 NOTE — Brief Op Note (Signed)
06/18/2020  10:26 AM  PATIENT:  Tammie Brown  39 y.o. female  PRE-OPERATIVE DIAGNOSIS:  PLANTER FASCITIS,FIBROMA  POST-OPERATIVE DIAGNOSIS:  PLANTER FASCITIS,FIBROMA  PROCEDURE:  Procedure(s): EXCISION OF PLANTAR FIBROMA LEFT ARCH, RADICAL RESECTION OF PLANTAR FASCIITIS (Left) GRAFT  APPLICATION (Left)  SURGEON:  Surgeon(s) and Role:    * Evelina Bucy, DPM - Primary  PHYSICIAN ASSISTANT:   ASSISTANTS: none   ANESTHESIA:   MAC  EBL:  5 mL   BLOOD ADMINISTERED:none  DRAINS: none   LOCAL MEDICATIONS USED:  MARCAINE    and Amount: 15 ml  SPECIMEN:   ID Type Source Tests Collected by Time Destination  1 : mass arch of left foot Tissue PATH Soft tissue SURGICAL PATHOLOGY Evelina Bucy, DPM 06/18/2020 0951       DISPOSITION OF SPECIMEN:  PATHOLOGY  COUNTS:  YES  TOURNIQUET:   Total Tourniquet Time Documented: Calf (Left) - 46 minutes Total: Calf (Left) - 46 minutes   DICTATION: .Viviann Spare Dictation  PLAN OF CARE: Discharge to home after PACU  PATIENT DISPOSITION:  PACU - hemodynamically stable.   Delay start of Pharmacological VTE agent (>24hrs) due to surgical blood loss or risk of bleeding: not applicable

## 2020-06-18 NOTE — Transfer of Care (Signed)
Immediate Anesthesia Transfer of Care Note  Patient: Tammie Brown  Procedure(s) Performed: EXCISION OF PLANTAR FIBROMA LEFT ARCH, RADICAL RESECTION OF PLANTAR FASCIITIS (Left Foot) GRAFT  APPLICATION (Left Foot)  Patient Location: PACU  Anesthesia Type:General  Level of Consciousness: awake, alert , oriented and patient cooperative  Airway & Oxygen Therapy: Patient Spontanous Breathing and Patient connected to nasal cannula oxygen  Post-op Assessment: Report given to RN, Post -op Vital signs reviewed and stable and Patient moving all extremities  Post vital signs: Reviewed and stable  Last Vitals:  Vitals Value Taken Time  BP    Temp    Pulse 93 06/18/20 1034  Resp 18 06/18/20 1034  SpO2 100 % 06/18/20 1034  Vitals shown include unvalidated device data.  Last Pain:  Vitals:   06/18/20 0846  PainSc: 0-No pain      Patients Stated Pain Goal: 5 (37/54/36 0677)  Complications: No complications documented.

## 2020-06-18 NOTE — Discharge Instructions (Signed)
   Post-Surgery Instructions  1. If you are recuperating from surgery anywhere other than home, please be sure to leave [place] a number where you can be reached. 2. Go directly home and rest. 3. The keep operated foot (or feet) elevated six inches above the hip when sitting or lying down. 4. Support the elevated foot and leg with pillows under the calf. DO NOT PLACE PILLOWS UNDER THE KNEE. 5. DO NOT REMOVE or get your bandages wet. This will increase your chances of getting an infection. 6. Wear your surgical shoe at all times when you are up. 7. A limited amount of pain and swelling may occur. The skin may take on a bruised appearance. This is no cause for alarm. 8. For slight pain and swelling, apply an ice pack directly over the bandage for 15 minutes every hour. Continue icing until seen in the office. DO NOT apply any form of heat to the area. 9. Have prescription(s) filled immediately and take as directed. 10. Drink lots of liquids, water, and juice. 11. CALL THE OFFICE IMMEDIATELY IF: a. Bleeding continues b. Pain increases and/or does not respond to medication c. Bandage or cast appears too tight d. Any liquids (water, coffee, etc.) have spilled on your bandages. e. Tripping, falling, or stubbing the surgical foot f. If your temperature rises above 101 g. If you have ANY questions at all 12. Please use the crutches, knee scooter, or walker you have prescribed, rented, or purchased. You are expected to be: ? weight-bearing x non-weight bearing  If you need to reach the nurse for any reason, please call: [place]: [phone] [place]: [phone] [place]: [phone]

## 2020-06-18 NOTE — Addendum Note (Signed)
Addendum  created 06/18/20 1306 by Myna Bright, CRNA   Charge Capture section accepted

## 2020-06-19 ENCOUNTER — Encounter (HOSPITAL_BASED_OUTPATIENT_CLINIC_OR_DEPARTMENT_OTHER): Payer: Self-pay | Admitting: Podiatry

## 2020-06-19 LAB — SURGICAL PATHOLOGY

## 2020-06-19 NOTE — Addendum Note (Signed)
Addendum  created 06/19/20 0837 by Justice Rocher, CRNA   Charge Capture section accepted

## 2020-06-23 ENCOUNTER — Telehealth: Payer: Self-pay | Admitting: Podiatry

## 2020-06-23 ENCOUNTER — Other Ambulatory Visit: Payer: Self-pay | Admitting: Podiatry

## 2020-06-23 MED ORDER — OXYCODONE-ACETAMINOPHEN 10-325 MG PO TABS: 1.0000 | ORAL_TABLET | ORAL | 0 refills | Status: DC | PRN

## 2020-06-23 NOTE — Telephone Encounter (Signed)
Pt called and stated that she does not use [facility] anymore if the medication could be sent to [facility] and medical supplies please resend

## 2020-06-23 NOTE — Addendum Note (Signed)
Addended by: Hardie Pulley on: 06/23/2020 12:53 PM   Modules accepted: Orders

## 2020-06-23 NOTE — Telephone Encounter (Signed)
Refilled can you inform

## 2020-06-23 NOTE — Telephone Encounter (Signed)
I told pt the pain medication had been sent to the [facility].

## 2020-06-23 NOTE — Telephone Encounter (Signed)
Pt called to request pain medication refill

## 2020-06-23 NOTE — Progress Notes (Signed)
Refilled pain rx 

## 2020-06-26 ENCOUNTER — Ambulatory Visit (INDEPENDENT_AMBULATORY_CARE_PROVIDER_SITE_OTHER): Payer: Medicaid Other | Admitting: Podiatry

## 2020-06-26 ENCOUNTER — Other Ambulatory Visit: Payer: Self-pay

## 2020-06-26 DIAGNOSIS — M722 Plantar fascial fibromatosis: Secondary | ICD-10-CM

## 2020-06-26 NOTE — Progress Notes (Signed)
°  Subjective:  Patient ID: Tammie Brown, female    DOB: 1981-05-06,  MRN: [id number]  Chief Complaint  Patient presents with   Routine Post Op    POV#1 DOS 6.23.2021 REMOVAL PLANTAR FIBROMA LT ARCH, POSSSIBLE APPLICATION AMNIOTIC GRAFT. Pt states no concerns and is healing well. Denies fever/chills/nausea/vomiting. Pt states "I jumped up on my foot suddenly".   DOS: 06/18/20 Procedure: Removal of plantar fibroma left  39 y.o. female presents with the above complaint. History confirmed with patient.   Objective:  Physical Exam: tenderness at the surgical site, local edema noted and calf supple, nontender. Incision: healing well, no significant drainage, no dehiscence, no significant erythema  Assessment:   1. Plantar fascial fibromatosis of left foot    Plan:  Patient was evaluated and treated and all questions answered.  Post-operative State -Dressing applied consisting of tirple abx ointment, sterile gauze, kerlix and ACE bandage -NWB with crutches  No follow-ups on file.

## 2020-07-03 ENCOUNTER — Other Ambulatory Visit: Payer: Self-pay

## 2020-07-03 ENCOUNTER — Ambulatory Visit (INDEPENDENT_AMBULATORY_CARE_PROVIDER_SITE_OTHER): Payer: Medicaid Other | Admitting: Podiatry

## 2020-07-03 DIAGNOSIS — G8918 Other acute postprocedural pain: Secondary | ICD-10-CM

## 2020-07-03 DIAGNOSIS — M722 Plantar fascial fibromatosis: Secondary | ICD-10-CM

## 2020-07-03 NOTE — Progress Notes (Signed)
  Subjective:  Patient ID: Tammie Brown, female    DOB: Sep 06, 1981,  MRN: [id number]  Chief Complaint  Patient presents with  . Routine Post Op    POV#2 DOS 6.23.2021 Removal plantar fibroma lt arch, possible application amniotic graft. Pt states healing well without any concerns. Denies fever/chills/nausea/vomiting.    DOS: 06/18/20 Procedure: Removal of plantar fibroma left  39 y.o. female presents with the above complaint. History confirmed with patient.   Objective:  Physical Exam: tenderness at the surgical site, local edema noted and calf supple, nontender. Incision: healing well, no significant drainage, no dehiscence, no significant erythema  Assessment:   1. Plantar fascial fibromatosis of left foot   2. Postoperative pain    Plan:  Patient was evaluated and treated and all questions answered.  Post-operative State -Dressing re-applied consisting of tirple abx ointment, sterile gauze, kerlix and ACE bandage -Suture removal next week, staple removal in 2 weeks -NWB with crutches  No follow-ups on file.

## 2020-07-04 NOTE — Op Note (Addendum)
Patient Name: Tammie Brown DOB: March 30, 1981  MRN: [id number]   Date of Service: 06/18/2020  Surgeon: Dr. Hardie Pulley, DPM Assistants: None Pre-operative Diagnosis:  Plantar fibroma Post-operative Diagnosis:  Same  Procedures:  1) Excision of Plantar Fibroma  2) Application of skin graft substitute Pathology/Specimens: ID Type Source Tests Collected by Time Destination  1 : mass arch of left foot Tissue PATH Soft tissue SURGICAL PATHOLOGY Evelina Bucy, DPM 06/18/2020 2637    Anesthesia: MAC, local Hemostasis:  Total Tourniquet Time Documented: Calf (Left) - 46 minutes Total: Calf (Left) - 46 minutes  Estimated Blood Loss: 5 mL Materials:  Implant Name Type Inv. Item Serial No. Manufacturer Lot No. LRB No. Used Action  TISSUE DERMASPAND 5X5 STRL - [id number] Orthopedic Graft TISSUE DERMASPAND 5X5 STRL [id number] [employer](ORTH,TRAU,BIO,SG)  Left 1 Implanted   Medications: None Complications: None  Indications for Procedure:  This is a 39 y.o. 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 She presents with pain and desires removal of the mass. It was discussed she would benefit from insertion of tissue grafting in order to attempt to prevent recurrence. All risks, benefits, and alternatives discussed. No guarantees given.   Procedure in Detail: Patient was identified in pre-operative holding area. Formal consent was signed and the left lower extremity was marked. Patient was brought back to the operating room. Anesthesia was induced. The extremity was prepped and draped in the usual sterile fashion. Timeout was taken to confirm patient name, laterality, and procedure prior to incision.   Attention was then directed to the left plantar midfoot at the area of the previous incision.  The previous incision was incised incision was carried down to level of the plantar fascia.  The plantar fibroma was then applied and was sharply excised.  A margin of  healthy fascia was included.  The area was resected down to healthy viable muscle.  The resection portion was collected for pathology. The resected area was 2.3 x 1.5 x 0.7.  The wound was copiously irrigated.  A dermaspan graft tissue was rehydrated cut to fit and sutured to the deficit to prevent regrowth of the fibroma.  The wound was further irrigated.  The skin was then closed in layers with 3-0,4-0 Monocryl 4-0 nylon and skin staples.  The foot was then dressed with xeroform, 4 x 4 Kerlix and Ace bandage. Patient tolerated the procedure well.   Disposition: Following a period of post-operative monitoring, patient will be transferred home.

## 2020-07-09 ENCOUNTER — Telehealth: Payer: Self-pay | Admitting: Podiatry

## 2020-07-09 MED ORDER — DIAZEPAM 5 MG PO TABS: ORAL_TABLET | ORAL | 0 refills | Status: AC

## 2020-07-09 NOTE — Addendum Note (Signed)
Addended by: Hardie Pulley on: 07/09/2020 05:29 PM   Modules accepted: Orders

## 2020-07-09 NOTE — Telephone Encounter (Signed)
Pt is scheduled to come in tomorrow to have her stitches removed and would like to know if the doctor can prescribe her something to take prior to coming in. Pt requested Valium.

## 2020-07-10 ENCOUNTER — Ambulatory Visit (INDEPENDENT_AMBULATORY_CARE_PROVIDER_SITE_OTHER): Payer: Medicaid Other | Admitting: Podiatry

## 2020-07-10 ENCOUNTER — Other Ambulatory Visit: Payer: Self-pay

## 2020-07-10 DIAGNOSIS — G8918 Other acute postprocedural pain: Secondary | ICD-10-CM

## 2020-07-10 DIAGNOSIS — M722 Plantar fascial fibromatosis: Secondary | ICD-10-CM

## 2020-07-10 MED ORDER — OXYCODONE-ACETAMINOPHEN 10-325 MG PO TABS: 1.0000 | ORAL_TABLET | ORAL | 0 refills | Status: AC | PRN

## 2020-07-10 NOTE — Progress Notes (Signed)
  Subjective:  Patient ID: Tammie Brown, female    DOB: Sep 03, 1981,  MRN: [id number]  Chief Complaint  Patient presents with  . Routine Post Op    POV#3 DOS 6.23.2021 REMOVAL PLANTAR FIBROMA LT ARCH, POSSIBLE APPLICATION AMNIOTIC GRAFT. Pt states no concerns and that she is healing well.   DOS: 06/18/20 Procedure: Removal of plantar fibroma left  39 y.o. female presents for post-op follow-up. Pain controlled. Denies other issues. States she slept in and didn't take her valium   Objective:  Physical Exam: tenderness at the surgical site, local edema noted and calf supple, nontender. Incision: healing well, no significant drainage, no dehiscence, no significant erythema  Assessment:   1. Plantar fascial fibromatosis of left foot   2. Postoperative pain    Plan:  Patient was evaluated and treated and all questions answered.  Post-operative State -Sutures removed. Staples left intact for removal next week. -Dressing re-applied consisting of tirple abx ointment, sterile gauze, kerlix and ACE bandage -Refill pain rx -NWB with crutches  No follow-ups on file.

## 2020-07-10 NOTE — Telephone Encounter (Signed)
Pt seen in office prior to lunch.

## 2020-07-17 ENCOUNTER — Other Ambulatory Visit: Payer: Self-pay

## 2020-07-17 ENCOUNTER — Ambulatory Visit (INDEPENDENT_AMBULATORY_CARE_PROVIDER_SITE_OTHER): Payer: Medicaid Other | Admitting: Podiatry

## 2020-07-17 DIAGNOSIS — M722 Plantar fascial fibromatosis: Secondary | ICD-10-CM

## 2020-07-17 DIAGNOSIS — G8918 Other acute postprocedural pain: Secondary | ICD-10-CM

## 2020-07-17 NOTE — Progress Notes (Signed)
  Subjective:  Patient ID: Tammie Brown, female    DOB: [birth year],  MRN: [id number]  Chief Complaint  Patient presents with  . Routine Post Op     POV #4 DOS 06/18/2020 REMOVAL PLANTAR FIBROMA LT ARCH, POSSIBLE APPLICATION AMNIOTIC GRAFT. No fever/chills/N&V. Pt stated, "Pain = 8/10 most of the time. I take the oxycodone when I need it".   DOS: 06/18/20 Procedure: Removal of plantar fibroma left  39 y.o. female presents for post-op follow-up. Pain getting better, still present. Did not take her valium again.  Objective:  Physical Exam: tenderness at the surgical site, local edema noted and calf supple, nontender. Incision: healing well, no significant drainage, no dehiscence, no significant erythema  Assessment:   1. Plantar fascial fibromatosis of left foot   2. Postoperative pain    Plan:  Patient was evaluated and treated and all questions answered.  Post-operative State -Staples removed today. Steris applied -No need for refill of pain rx -NWB with crutches for one more week. Start WB next week in CAM boot -Plan for transition of WB status at next visit  Return in about 3 weeks (around 08/07/2020) for Post-Op (No XRs).

## 2020-08-12 ENCOUNTER — Other Ambulatory Visit: Payer: Self-pay

## 2020-08-12 ENCOUNTER — Ambulatory Visit (INDEPENDENT_AMBULATORY_CARE_PROVIDER_SITE_OTHER): Payer: Medicaid Other | Admitting: Podiatry

## 2020-08-12 DIAGNOSIS — Z5321 Procedure and treatment not carried out due to patient leaving prior to being seen by health care provider: Secondary | ICD-10-CM

## 2020-08-12 NOTE — Progress Notes (Signed)
Patient left without being seen.

## 2020-08-22 ENCOUNTER — Encounter: Payer: Medicaid Other | Admitting: Podiatry

## 2020-08-28 DIAGNOSIS — M79676 Pain in unspecified toe(s): Secondary | ICD-10-CM

## 2020-09-05 ENCOUNTER — Other Ambulatory Visit: Payer: Self-pay

## 2020-09-05 ENCOUNTER — Encounter: Payer: Medicaid Other | Admitting: Podiatry

## 2020-09-09 ENCOUNTER — Ambulatory Visit (INDEPENDENT_AMBULATORY_CARE_PROVIDER_SITE_OTHER): Payer: Medicaid Other | Admitting: Podiatry

## 2020-09-09 DIAGNOSIS — Z9889 Other specified postprocedural states: Secondary | ICD-10-CM

## 2020-09-09 DIAGNOSIS — Z5329 Procedure and treatment not carried out because of patient's decision for other reasons: Secondary | ICD-10-CM

## 2020-09-09 DIAGNOSIS — M79676 Pain in unspecified toe(s): Secondary | ICD-10-CM

## 2020-09-09 NOTE — Progress Notes (Signed)
   Complete physical exam  Patient: Tammie Brown   DOB: 10/16/1997   39 y.o. Female  MRN: [id number]  Subjective:    No chief complaint on file.   Tammie Brown is a 39 y.o. female who presents today for a complete physical exam. She reports consuming a {diet types:17450} diet. {types:19826} She generally feels {DESC; WELL/FAIRLY WELL/POORLY:18703}. She reports sleeping {DESC; WELL/FAIRLY WELL/POORLY:18703}. She {does/does not:200015} have additional problems to discuss today.    Most recent fall risk assessment:    06/23/2022   10:42 AM  Fall Risk   Falls in the past year? 0  Number falls in past yr: 0  Injury with Fall? 0  Risk for fall due to : No Fall Risks  Follow up Falls evaluation completed     Most recent depression screenings:    06/23/2022   10:42 AM 05/14/2021   10:46 AM  PHQ 2/9 Scores  PHQ - 2 Score 0 0  PHQ- 9 Score 5     {VISON DENTAL STD PSA (Optional):27386}  {History (Optional):23778}  Patient Care Team: Jessup, Joy, NP as PCP - General (Nurse Practitioner)   Outpatient Medications Prior to Visit  Medication Sig   fluticasone (FLONASE) 50 MCG/ACT nasal spray Place 2 sprays into both nostrils in the morning and at bedtime. After 7 days, reduce to once daily.   norgestimate-ethinyl estradiol (SPRINTEC 28) 0.25-35 MG-MCG tablet Take 1 tablet by mouth daily.   Nystatin POWD Apply liberally to affected area 2 times per day   spironolactone (ALDACTONE) 100 MG tablet Take 1 tablet (100 mg total) by mouth daily.   No facility-administered medications prior to visit.    ROS        Objective:     There were no vitals taken for this visit. {Vitals History (Optional):23777}  Physical Exam   No results found for any visits on 07/29/22. {Show previous labs (optional):23779}    Assessment & Plan:    Routine Health Maintenance and Physical Exam  Immunization History  Administered Date(s) Administered   DTaP 12/30/1999, 02/25/2000,  05/05/2000, 01/19/2001, 08/04/2004   Hepatitis A 05/31/2008, 06/06/2009   Hepatitis B 10/17/1999, 11/24/1999, 05/05/2000   HiB (PRP-OMP) 12/30/1999, 02/25/2000, 05/05/2000, 01/19/2001   IPV 12/30/1999, 02/25/2000, 10/24/2000, 08/04/2004   Influenza,inj,Quad PF,6+ Mos 09/06/2014   Influenza-Unspecified 12/06/2012   MMR 10/24/2001, 08/04/2004   Meningococcal Polysaccharide 06/05/2012   Pneumococcal Conjugate-13 01/19/2001   Pneumococcal-Unspecified 05/05/2000, 07/19/2000   Tdap 06/05/2012   Varicella 10/24/2000, 05/31/2008    Health Maintenance  Topic Date Due   HIV Screening  Never done   Hepatitis C Screening  Never done   INFLUENZA VACCINE  07/27/2022   PAP-Cervical Cytology Screening  07/29/2022 (Originally 10/15/2020)   PAP SMEAR-Modifier  07/29/2022 (Originally 10/15/2020)   TETANUS/TDAP  07/29/2022 (Originally 06/05/2022)   HPV VACCINES  Discontinued   COVID-19 Vaccine  Discontinued    Discussed health benefits of physical activity, and encouraged her to engage in regular exercise appropriate for her age and condition.  Problem List Items Addressed This Visit   None Visit Diagnoses     Annual physical exam    -  Primary   Cervical cancer screening       Need for Tdap vaccination          No follow-ups on file.     Joy Jessup, NP   

## 2020-09-19 ENCOUNTER — Encounter: Payer: Medicaid Other | Admitting: Podiatry

## 2021-06-06 ENCOUNTER — Emergency Department (HOSPITAL_COMMUNITY)
Admission: EM | Admit: 2021-06-06 | Discharge: 2021-06-06 | Discharge status: Against Medical Advice | Payer: Medicaid Other | Attending: Emergency Medicine | Admitting: Emergency Medicine

## 2021-06-06 ENCOUNTER — Encounter (HOSPITAL_COMMUNITY): Payer: Self-pay | Admitting: Emergency Medicine

## 2021-06-06 ENCOUNTER — Other Ambulatory Visit: Payer: Self-pay

## 2021-06-06 DIAGNOSIS — Z28310 Unvaccinated for covid-19: Secondary | ICD-10-CM | POA: Diagnosis not present

## 2021-06-06 DIAGNOSIS — F17210 Nicotine dependence, cigarettes, uncomplicated: Secondary | ICD-10-CM | POA: Diagnosis not present

## 2021-06-06 DIAGNOSIS — U071 COVID-19: Secondary | ICD-10-CM | POA: Diagnosis not present

## 2021-06-06 DIAGNOSIS — M791 Myalgia, unspecified site: Secondary | ICD-10-CM | POA: Diagnosis present

## 2021-06-06 LAB — RESP PANEL BY RT-PCR (FLU A&B, COVID) ARPGX2
Influenza A by PCR: NEGATIVE
Influenza B by PCR: NEGATIVE
SARS Coronavirus 2 by RT PCR: POSITIVE — AB

## 2021-06-06 NOTE — ED Notes (Signed)
Pt became upset having to wait for results. Pt states she is leaving and will look for results on MyChart.

## 2021-06-06 NOTE — ED Notes (Signed)
Provider aware patient is requesting update. Patient advised results are still pending.

## 2021-06-06 NOTE — ED Triage Notes (Signed)
Patient reports cough, body aches, and "not feeling well" since yesterday.

## 2021-06-06 NOTE — ED Notes (Signed)
Patient provided with water.

## 2021-06-06 NOTE — ED Provider Notes (Signed)
[facility] DEPT Provider Note   CSN: [id number] Arrival date & time: 06/06/21  0802     History Chief Complaint  Patient presents with   Generalized Body Aches    Tammie Brown is a 40 y.o. female.  HPI She reports onset of rhinorrhea, achiness, fever and chills yesterday.  She has not had a COVID-vaccine.  She denies vomiting, dizziness, focal weakness or paresthesia.  There are no other known active modifying factors.    Past Medical History:  Diagnosis Date   Chlamydia    Gonorrhea    Headache(784.0)    Heart murmur    yrs ago, no dr mentioned in years no cardiologist   PID (acute pelvic inflammatory disease)     Patient Active Problem List   Diagnosis Date Noted   Peritonsillar abscess 06/22/2019   Smoker 04/08/2014   Contraception management 04/08/2014   Gonorrhea 08/17/2012   Allergy to cephalosporin 08/17/2012    Past Surgical History:  Procedure Laterality Date   APPENDECTOMY  yrs ago   [place]  2001 & 2009   FASCIECTOMY Left 06/18/2020   Procedure: EXCISION OF PLANTAR FIBROMA LEFT ARCH, RADICAL RESECTION OF PLANTAR FASCIITIS;  Surgeon: Evelina Bucy, DPM;  Location: [facility];  Service: Podiatry;  Laterality: Left;   FOOT SURGERY Left 2019 and 3403   GRAFT APPLICATION Left 07/04/6437   Procedure: GRAFT  APPLICATION;  Surgeon: Evelina Bucy, DPM;  Location: [facility];  Service: Podiatry;  Laterality: Left;     OB History     Gravida  2   Para  2   Term  2   Preterm      AB      Living  2      SAB      IAB      Ectopic      Multiple      Live Births  1           Family History  Problem Relation Age of Onset   Asthma Mother    Hypertension Mother    Diabetes Father    Cancer Maternal Grandfather        throat   Other Neg Hx    Hearing loss Neg Hx     Social History   Tobacco Use   Smoking status: Some Days    Years:  15.00    Pack years: 0.00    Types: Cigarettes   Smokeless tobacco: Never   Tobacco comments:    occ smoker  Vaping Use   Vaping Use: Never used  Substance Use Topics   Alcohol use: Yes    Comment: occ    Drug use: Yes    Frequency: 2.0 times per week    Types: Marijuana    Comment: marijuana last used 06-09-2020    Home Medications Prior to Admission medications   Medication Sig Start Date End Date Taking? Authorizing Provider  baclofen (LIORESAL) 10 MG tablet Take 10 mg by mouth 2 (two) times daily. 02/20/20   [provider]  celecoxib (CELEBREX) 100 MG capsule Take 100 mg by mouth every other day.  02/20/20   [provider]  diazepam (VALIUM) 5 MG tablet Take 1 tablet 30 mins prior to procedure. Bring other tablet with you to the appointment. 07/09/20   Evelina Bucy, DPM  fexofenadine-pseudoephedrine (ALLEGRA-D) 60-120 MG 12 hr tablet Take 1 tablet by mouth  every 12 (twelve) hours. Patient taking differently: Take 1 tablet by mouth every 12 (twelve) hours. Out of medication will refill 04/05/20   Orpah Greek, MD  gabapentin (NEURONTIN) 100 MG capsule Take 100 mg by mouth at bedtime.  02/20/20   [provider]  ibuprofen (ADVIL) 800 MG tablet Take 1 tablet (800 mg total) by mouth every 8 (eight) hours as needed. 07/13/19   Evelina Bucy, DPM  Multiple Vitamin (MULTI-VITAMIN) tablet Take by mouth.    [provider]  NARCAN 4 MG/0.1ML LIQD nasal spray kit SMARTSIG:1 Spray(s) Both Nares PRN 02/11/20   [provider]  oxyCODONE-acetaminophen (PERCOCET) 10-325 MG tablet Take 1 tablet by mouth every 4 (four) hours as needed for pain. 07/10/20   Evelina Bucy, DPM  Vitamin D, Ergocalciferol, (DRISDOL) 1.25 MG (50000 UNIT) CAPS capsule Take 50,000 Units by mouth once a week. 02/08/20   [provider]    Allergies    Penicillins and Tuberculin tests  Review of Systems   Review of Systems  All other systems reviewed  and are negative.  Physical Exam Updated Vital Signs BP 121/84 (BP Location: Left Arm)   Pulse 85   Temp 98.9 F (37.2 C) (Oral)   Resp (!) 22   SpO2 99%   Physical Exam Vitals and nursing note reviewed.  Constitutional:      Appearance: She is well-developed. She is not ill-appearing.  HENT:     Head: Normocephalic and atraumatic.     Right Ear: External ear normal.     Left Ear: External ear normal.     Nose:     Comments: Clear rhinorrhea Eyes:     Conjunctiva/sclera: Conjunctivae normal.     Pupils: Pupils are equal, round, and reactive to light.  Neck:     Trachea: Phonation normal.  Cardiovascular:     Rate and Rhythm: Normal rate.  Pulmonary:     Effort: Pulmonary effort is normal. No respiratory distress.     Breath sounds: No stridor.  Abdominal:     General: There is no distension.     Palpations: Abdomen is soft.  Musculoskeletal:        General: Normal range of motion.     Cervical back: Normal range of motion and neck supple.  Skin:    General: Skin is warm and dry.  Neurological:     Mental Status: She is alert and oriented to person, place, and time.     Cranial Nerves: No cranial nerve deficit.     Sensory: No sensory deficit.     Motor: No abnormal muscle tone.     Coordination: Coordination normal.  Psychiatric:        Mood and Affect: Mood normal.        Behavior: Behavior normal.        Thought Content: Thought content normal.        Judgment: Judgment normal.    ED Results / Procedures / Treatments   Labs (all labs ordered are listed, but only abnormal results are displayed) Labs Reviewed - No data to display  EKG None  Radiology No results found.  Procedures Procedures   Medications Ordered in ED Medications - No data to display  ED Course  I have reviewed the triage vital signs and the nursing notes.  Pertinent labs & imaging results that were available during my care of the patient were reviewed by me and considered in my  medical decision making (see chart  for details).    MDM Rules/Calculators/A&P                           Patient Vitals for the past 24 hrs:  BP Temp Temp src Pulse Resp SpO2  06/06/21 0812 121/84 98.9 F (37.2 C) Oral 85 (!) 22 99 %    1:20 PM-patient stormed out, without waiting test results.  She left AMA  Medical Decision Making:  This patient is presenting for evaluation of myalgias, which does require a range of treatment options, and is a complaint that involves a moderate risk of morbidity and mortality. The differential diagnoses include URI, COVID infection, nonspecific achiness. I decided to review old records, and in summary middle-aged female, presenting with nonspecific symptoms.  I did not require additional historical information from anyone.  Clinical Laboratory Tests Ordered, included  viral panel . Review indicates COVID-positive.   Critical Interventions-clinical evaluation, laboratory testing  After These Interventions, the Patient was reevaluated and was found with positive COVID test.  Unfortunately, the patient left prior to completion of laboratory resulting, and therefore did not get instructions on care management of her illness.  CRITICAL CARE-no Performed by: Daleen Bo  Nursing Notes Reviewed/ Care Coordinated Applicable Imaging Reviewed Interpretation of Laboratory Data incorporated into ED treatment  [place] prior to receiving instructions    Final Clinical Impression(s) / ED Diagnoses Final diagnoses:  COVID-19 virus infection    Rx / DC Orders ED Discharge Orders     None        Daleen Bo, MD 06/06/21 1747

## 2021-08-07 ENCOUNTER — Ambulatory Visit (INDEPENDENT_AMBULATORY_CARE_PROVIDER_SITE_OTHER): Payer: Medicaid Other | Admitting: Podiatry

## 2021-08-07 DIAGNOSIS — Z5329 Procedure and treatment not carried out because of patient's decision for other reasons: Secondary | ICD-10-CM

## 2021-09-24 ENCOUNTER — Emergency Department (HOSPITAL_COMMUNITY)
Admission: EM | Admit: 2021-09-24 | Discharge: 2021-09-24 | Disposition: A | Discharge status: Home / Self Care | Payer: Medicaid Other | Attending: Emergency Medicine | Admitting: Emergency Medicine

## 2021-09-24 ENCOUNTER — Other Ambulatory Visit: Payer: Self-pay

## 2021-09-24 ENCOUNTER — Encounter (HOSPITAL_COMMUNITY): Payer: Self-pay

## 2021-09-24 DIAGNOSIS — R591 Generalized enlarged lymph nodes: Secondary | ICD-10-CM | POA: Diagnosis not present

## 2021-09-24 DIAGNOSIS — R0981 Nasal congestion: Secondary | ICD-10-CM | POA: Insufficient documentation

## 2021-09-24 DIAGNOSIS — F17210 Nicotine dependence, cigarettes, uncomplicated: Secondary | ICD-10-CM | POA: Diagnosis not present

## 2021-09-24 NOTE — ED Triage Notes (Addendum)
Patient said she has a knot in the back of her neck that now has moved behind her ear. It has been going on for 2 days. It made her head hurt on the right side. Patient said it is circular and around the size of a dime. Patient said one of her teeth on the right side started decaying her gum and is taking Clindamycin right now for that.

## 2021-09-24 NOTE — Discharge Instructions (Addendum)
Please take Ibuprofen and Tylenol as needed for pain. You can apply either ice or heat to the area - whichever seems more beneficial for you.   Continue taking your antibiotic as prescribed  Follow up with your PCP for further eval   Return to the ED for any new/worsening symptoms

## 2021-09-24 NOTE — ED Provider Notes (Signed)
[facility] DEPT Provider Note   CSN: [id number] Arrival date & time: 09/24/21  1948     History Chief Complaint  Patient presents with   Abscess    Tammie Brown is a 40 y.o. female who presents to the ED today with concern for abscess.  She states she has noticed a painful knot behind her right ear about 2 days ago.  She does mention she is currently on clindamycin for a dental infection and plans to get dental procedure done next week.  She states she was very concerned as her father died of throat/mouth cancer and she is a current every day smoker.  She denies any unexpected weight loss.  Denies any nausea, vomiting.  She has been taking ibuprofen for her dental infection however has not noticed if it is helped too much with the painful knot.  She also mentions she has been slightly congested for the past couple of days as well however denies any fevers, chills, cough, ear pain.  The history is provided by the patient and medical records.      Past Medical History:  Diagnosis Date   Chlamydia    Gonorrhea    Headache(784.0)    Heart murmur    yrs ago, no dr mentioned in years no cardiologist   PID (acute pelvic inflammatory disease)     Patient Active Problem List   Diagnosis Date Noted   Peritonsillar abscess 06/22/2019   Smoker 04/08/2014   Contraception management 04/08/2014   Gonorrhea 08/17/2012   Allergy to cephalosporin 08/17/2012    Past Surgical History:  Procedure Laterality Date   APPENDECTOMY  yrs ago   [place]  2001 & 2009   FASCIECTOMY Left 06/18/2020   Procedure: EXCISION OF PLANTAR FIBROMA LEFT ARCH, RADICAL RESECTION OF PLANTAR FASCIITIS;  Surgeon: Evelina Bucy, DPM;  Location: [facility];  Service: Podiatry;  Laterality: Left;   FOOT SURGERY Left 2019 and 1027   GRAFT APPLICATION Left 2/53/6644   Procedure: GRAFT  APPLICATION;  Surgeon: Evelina Bucy, DPM;   Location: [facility];  Service: Podiatry;  Laterality: Left;     OB History     Gravida  2   Para  2   Term  2   Preterm      AB      Living  2      SAB      IAB      Ectopic      Multiple      Live Births  1           Family History  Problem Relation Age of Onset   Asthma Mother    Hypertension Mother    Diabetes Father    Cancer Maternal Grandfather        throat   Other Neg Hx    Hearing loss Neg Hx     Social History   Tobacco Use   Smoking status: Some Days    Years: 15.00    Types: Cigarettes   Smokeless tobacco: Never   Tobacco comments:    occ smoker  Vaping Use   Vaping Use: Never used  Substance Use Topics   Alcohol use: Yes    Comment: occ    Drug use: Yes    Frequency: 2.0 times per week    Types: Marijuana    Comment: twice a day, every day  Home Medications Prior to Admission medications   Medication Sig Start Date End Date Taking? Authorizing Provider  baclofen (LIORESAL) 10 MG tablet Take 10 mg by mouth 2 (two) times daily. 02/20/20   [provider]  celecoxib (CELEBREX) 100 MG capsule Take 100 mg by mouth every other day.  02/20/20   [provider]  diazepam (VALIUM) 5 MG tablet Take 1 tablet 30 mins prior to procedure. Bring other tablet with you to the appointment. 07/09/20   Evelina Bucy, DPM  fexofenadine-pseudoephedrine (ALLEGRA-D) 60-120 MG 12 hr tablet Take 1 tablet by mouth every 12 (twelve) hours. Patient taking differently: Take 1 tablet by mouth every 12 (twelve) hours. Out of medication will refill 04/05/20   Orpah Greek, MD  gabapentin (NEURONTIN) 100 MG capsule Take 100 mg by mouth at bedtime.  02/20/20   [provider]  ibuprofen (ADVIL) 800 MG tablet Take 1 tablet (800 mg total) by mouth every 8 (eight) hours as needed. 07/13/19   Evelina Bucy, DPM  Multiple Vitamin (MULTI-VITAMIN) tablet Take by mouth.    [provider]  NARCAN 4 MG/0.1ML  LIQD nasal spray kit SMARTSIG:1 Spray(s) Both Nares PRN 02/11/20   [provider]  oxyCODONE-acetaminophen (PERCOCET) 10-325 MG tablet Take 1 tablet by mouth every 4 (four) hours as needed for pain. 07/10/20   Evelina Bucy, DPM  Vitamin D, Ergocalciferol, (DRISDOL) 1.25 MG (50000 UNIT) CAPS capsule Take 50,000 Units by mouth once a week. 02/08/20   [provider]    Allergies    Penicillins and Tuberculin tests  Review of Systems   Review of Systems  Constitutional:  Negative for chills, fever and unexpected weight change.  HENT:  Positive for dental problem. Negative for drooling.   All other systems reviewed and are negative.  Physical Exam Updated Vital Signs BP 121/84 (BP Location: Left Arm)   Pulse 96   Temp 98.3 F (36.8 C) (Oral)   Resp 16   Ht $R'5\' 4"'aM$  (1.626 m)   Wt 82.6 kg   SpO2 100%   BMI 31.24 kg/m   Physical Exam Vitals and nursing note reviewed.  Constitutional:      Appearance: She is not ill-appearing.  HENT:     Head: Normocephalic and atraumatic.     Ears:     Comments: Post auricular lymphnode to R side; 1 x 1 cm; mildly TTP    Nose: Congestion present.     Mouth/Throat:     Comments: Poor dentition throughout. No oral lesions appreciated.  Eyes:     Conjunctiva/sclera: Conjunctivae normal.  Cardiovascular:     Rate and Rhythm: Normal rate and regular rhythm.  Pulmonary:     Effort: Pulmonary effort is normal.     Breath sounds: Normal breath sounds.  Abdominal:     Palpations: Abdomen is soft.     Tenderness: There is no abdominal tenderness.  Musculoskeletal:     Cervical back: Neck supple.  Skin:    General: Skin is warm and dry.     Coloration: Skin is not jaundiced.  Neurological:     Mental Status: She is alert.    ED Results / Procedures / Treatments   Labs (all labs ordered are listed, but only abnormal results are displayed) Labs Reviewed - No data to display  EKG None  Radiology No results  found.  Procedures Procedures   Medications Ordered in ED Medications - No data to display  ED Course  I have  reviewed the triage vital signs and the nursing notes.  Pertinent labs & imaging results that were available during my care of the patient were reviewed by me and considered in my medical decision making (see chart for details).    MDM Rules/Calculators/A&P                           40 year old female who presents to the ED today with concern for possible abscess that she noticed behind her right ear 2 days ago.  On arrival to the ED vitals are stable patient appears to be no acute distress.  I was asked to triage this patient however it is noted that she has a postauricular lymph node that is slightly enlarged, 1 x 1 cm.  She is currently on antibiotics for dental infection with plans for dental procedure next week.  She denies any concerning symptoms at this time.  Her exam does seem consistent with a lymph node and I do not feel she requires any additional work-up today.  Have instructed that she take ibuprofen and Tylenol as needed for pain and apply ice or heat, whichever 1 feels better for her.  Have advised that she follow-up with her primary care physician for further eval if she notices more lymph nodes or starts developing worrisome symptoms.  She is advised she can come to the ED at that time as well.  She does admit that she is anxious as her father died of throat cancer and she is a current every day smoker.  I do not evaluate any oral lesions that are concerning.  She does have poor dentition throughout and again is on antibiotics for same.  We will discharge patient from triage room at this time   This note was prepared using Dragon voice recognition software and may include unintentional dictation errors due to the inherent limitations of voice recognition software.   Final Clinical Impression(s) / ED Diagnoses Final diagnoses:  Lymphadenopathy    Rx / DC  Orders ED Discharge Orders     None        Discharge Instructions      Please take Ibuprofen and Tylenol as needed for pain. You can apply either ice or heat to the area - whichever seems more beneficial for you.   Continue taking your antibiotic as prescribed  Follow up with your PCP for further eval   Return to the ED for any new/worsening symptoms       Eustaquio Maize, PA-C 09/24/21 2008    Lennice Sites, DO 09/24/21 2353

## 2021-11-04 ENCOUNTER — Encounter (HOSPITAL_COMMUNITY): Payer: Medicaid Other

## 2021-11-05 ENCOUNTER — Other Ambulatory Visit (HOSPITAL_COMMUNITY): Payer: Self-pay | Admitting: Physician Assistant

## 2021-11-05 ENCOUNTER — Other Ambulatory Visit: Payer: Self-pay

## 2021-11-05 ENCOUNTER — Ambulatory Visit (HOSPITAL_COMMUNITY)
Admission: RE | Admit: 2021-11-05 | Discharge: 2021-11-05 | Disposition: A | Discharge status: Home / Self Care | Payer: Medicaid Other | Source: Ambulatory Visit | Attending: Physician Assistant | Admitting: Physician Assistant

## 2021-11-05 DIAGNOSIS — M79604 Pain in right leg: Secondary | ICD-10-CM

## 2022-01-15 ENCOUNTER — Emergency Department (HOSPITAL_COMMUNITY)
Admission: EM | Admit: 2022-01-15 | Discharge: 2022-01-15 | Disposition: A | Discharge status: Home / Self Care | Payer: Medicaid Other | Attending: Emergency Medicine | Admitting: Emergency Medicine

## 2022-01-15 ENCOUNTER — Encounter (HOSPITAL_COMMUNITY): Payer: Self-pay | Admitting: *Deleted

## 2022-01-15 ENCOUNTER — Other Ambulatory Visit: Payer: Self-pay

## 2022-01-15 DIAGNOSIS — M545 Low back pain, unspecified: Secondary | ICD-10-CM | POA: Diagnosis present

## 2022-01-15 DIAGNOSIS — G8929 Other chronic pain: Secondary | ICD-10-CM | POA: Diagnosis not present

## 2022-01-15 HISTORY — DX: Other chronic pain: G89.29

## 2022-01-15 MED ORDER — PREDNISONE 10 MG PO TABS: 40.0000 mg | ORAL_TABLET | Freq: Every day | ORAL | 0 refills | Status: AC

## 2022-01-15 MED ORDER — METHYL SALICYLATE-LIDO-MENTHOL 4-4-5 % EX PTCH: 1.0000 | MEDICATED_PATCH | Freq: Every day | CUTANEOUS | 0 refills | Status: AC

## 2022-01-15 NOTE — ED Provider Notes (Signed)
[facility] DEPT Provider Note   CSN: [id number] Arrival date & time: 01/15/22  1321     History  Chief Complaint  Patient presents with   Back Pain    Tammie Brown is a well-appearing 41 y.o. female with history of chronic back pain presenting today for acute back spasms that are not relieved with normal methods.  Patient states meloxicam usually helps relieve the pain but is not helping in this case.  Pain described as constant and 10 out of 10.  Leaning backwards makes the pain worse.  She has had chronic back pain for "years" currently receives treatment for her lower back via [facility].  Patient states she has been recommended to receive injections once every 2 to 4 weeks and has received at least 10 injections.  Last injection was 10/2021 and she is not been back to see them for her back since.  She states injections to provide some relief but now are providing minimal to no relief.  She states she has general weakness to her legs with the left being more significant than the right.  She also sees [facility] for her knee and hip and has an appointment with them this coming Thursday to start also treating her lower back.  Denies incontinence or dysfunction of her bowels.  Denies chest pain, recent illness, fever, shortness of breath, inability to walk, dizziness, lightheadedness.  She does not currently receive pain management for chronic pain.  She presents today with a "friend ".  The history is provided by the patient, medical records and a friend.  Back Pain Associated symptoms: no abdominal pain, no chest pain, no dysuria, no fever and no weakness       Home Medications Prior to Admission medications   Medication Sig Start Date End Date Taking? Authorizing Provider  baclofen (LIORESAL) 10 MG tablet Take 10 mg by mouth 2 (two) times daily. 02/20/20   [provider]  celecoxib (CELEBREX) 100 MG capsule Take 100 mg by mouth every  other day.  02/20/20   [provider]  diazepam (VALIUM) 5 MG tablet Take 1 tablet 30 mins prior to procedure. Bring other tablet with you to the appointment. 07/09/20   Evelina Bucy, DPM  fexofenadine-pseudoephedrine (ALLEGRA-D) 60-120 MG 12 hr tablet Take 1 tablet by mouth every 12 (twelve) hours. Patient taking differently: Take 1 tablet by mouth every 12 (twelve) hours. Out of medication will refill 04/05/20   Orpah Greek, MD  gabapentin (NEURONTIN) 100 MG capsule Take 100 mg by mouth at bedtime.  02/20/20   [provider]  ibuprofen (ADVIL) 800 MG tablet Take 1 tablet (800 mg total) by mouth every 8 (eight) hours as needed. 07/13/19   Evelina Bucy, DPM  Multiple Vitamin (MULTI-VITAMIN) tablet Take by mouth.    [provider]  NARCAN 4 MG/0.1ML LIQD nasal spray kit SMARTSIG:1 Spray(s) Both Nares PRN 02/11/20   [provider]  oxyCODONE-acetaminophen (PERCOCET) 10-325 MG tablet Take 1 tablet by mouth every 4 (four) hours as needed for pain. 07/10/20   Evelina Bucy, DPM  Vitamin D, Ergocalciferol, (DRISDOL) 1.25 MG (50000 UNIT) CAPS capsule Take 50,000 Units by mouth once a week. 02/08/20   [provider]      Allergies    Penicillins and Tuberculin tests    Review of Systems   Review of Systems  Constitutional:  Negative for chills, diaphoresis and fever.  HENT:  Negative for congestion, rhinorrhea  and sore throat.   Respiratory:  Negative for cough and shortness of breath.   Cardiovascular:  Negative for chest pain and palpitations.  Gastrointestinal:  Negative for abdominal pain, constipation, diarrhea, nausea and vomiting.  Genitourinary:  Negative for decreased urine volume, difficulty urinating, dysuria, hematuria and urgency.       Negative for incontinence  Musculoskeletal:  Positive for back pain, gait problem and myalgias. Negative for arthralgias, joint swelling, neck pain and neck stiffness.  Skin:  Negative for  color change and rash.  Neurological:  Negative for dizziness, weakness and light-headedness.       Decreased sensation of the left leg  All other systems reviewed and are negative.  Physical Exam Updated Vital Signs BP 140/68 (BP Location: Right Arm)    Pulse 63    Temp 98.7 F (37.1 C) (Oral)    Resp 18    Ht 5' 4"  (1.626 m)    Wt 88 kg    SpO2 96%    BMI 33.30 kg/m  Physical Exam Vitals and nursing note reviewed.  Constitutional:      General: She is not in acute distress.    Appearance: Normal appearance. She is well-developed. She is not ill-appearing or diaphoretic.  HENT:     Head: Normocephalic and atraumatic.  Eyes:     General: No scleral icterus.    Conjunctiva/sclera: Conjunctivae normal.  Cardiovascular:     Rate and Rhythm: Normal rate and regular rhythm.     Pulses:          Posterior tibial pulses are 2+ on the right side and 2+ on the left side.  Pulmonary:     Effort: Pulmonary effort is normal. No respiratory distress.  Abdominal:     Palpations: Abdomen is soft.  Musculoskeletal:        General: Tenderness present. No swelling, deformity or signs of injury.     Cervical back: Neck supple.     Lumbar back: Tenderness and bony tenderness present. No swelling, edema, deformity, signs of trauma or lacerations. Decreased range of motion.       Back:     Right hip: Tenderness present. No deformity. Normal strength.     Left hip: Tenderness present. No deformity. Normal strength.     Right upper leg: Normal.     Left upper leg: Normal.     Right lower leg: No edema.     Left lower leg: No edema.     Comments:  Exam of back and lower extremities inconsistent across exam ROM appears 5/5 of bilateral lower extremities No obvious deformities, lacerations/trauma, or contusions Right hip more TTP than left hip  Skin:    General: Skin is warm and dry.     Capillary Refill: Capillary refill takes less than 2 seconds.     Coloration: Skin is not pale.      Findings: No erythema or rash.  Neurological:     Mental Status: She is alert and oriented to person, place, and time.     Sensory: Sensory deficit present.  Psychiatric:        Mood and Affect: Mood normal.    ED Results / Procedures / Treatments   Labs (all labs ordered are listed, but only abnormal results are displayed) Labs Reviewed - No data to display  EKG None  Radiology No results found.  Procedures Procedures    Medications Ordered in ED Medications - No data to display  ED Course/ Medical Decision Making/ A&P  Medical Decision Making Amount and/or Complexity of Data Reviewed Labs: ordered. Decision-making details documented in ED Course. Radiology: ordered and independent interpretation performed. Decision-making details documented in ED Course. ECG/medicine tests: ordered and independent interpretation performed. Decision-making details documented in ED Course.   41 year old well-appearing female presents to the ED for concern of acute lower back pain.  This involves an extensive number of treatment options, and is a complaint that carries with it a high risk of complications and morbidity.  The differential diagnosis includes degenerative disc disease with or without lumbar radiculopathy, cauda equina syndrome, muscle strain or trauma.  Comorbidities that complicate the patient evaluation include long-term tobacco use.  Additional history obtained from internal and external records available within epic.  I considered ordering imaging of her spine, but the patient's symptoms are similar to her previous episodes, she does not have any urinary or bowel incontinence, and has longstanding chronic back pain that has recent imaging.  Is also seeing her orthopedics office next week for continuation of her treatment.  Has a longstanding history of chronic back pain that is currently being treated with steroid injections.  She describes this  pain is similar but slightly increased more than previous episodes.  She has no loss of sensation in her legs, no urinary or bowel incontinence, and is able to walk without signs of neurodeficits, so I do not believe she has cauda equina syndrome.  Skin is not broken and no signs of obvious deformity on physical exams, so I do not believe she experienced trauma to the area.  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  She may or may not be experiencing radiculopathy based on her physical exam and presentations.  She states there is some decreased sensation on her right leg but that it has been gradual and minimal and is the same side that she has established bursitis of the right hip.  Her pulses are not diminished, and no obvious edema bilaterally, so I doubt there is vascular cause for her numbness.  Overall I am uncertain of the exact etiology of patient's symptoms, however I do not believe she's experiencing a medical, surgical, or psychiatric emergency.    Encouraged to patient to keep appointment with orthopedics next week.  Provided an short course of steroids and methyl salicylate patches to reduce her pain until her appointment.   Patient in agreement and has no further questions.        Final Clinical Impression(s) / ED Diagnoses Final diagnoses:  None    Rx / DC Orders ED Discharge Orders     None         Candace Cruise 38/17/71 1514    Carmin Muskrat, MD 01/15/22 1525

## 2022-01-15 NOTE — ED Triage Notes (Signed)
Yesterday back spasms going up back, pt has taken prescribed meds with little relief. Pt has chronic back pain, she missed her last injection appointment.

## 2022-01-15 NOTE — Discharge Instructions (Addendum)
Take the 40 mg of prednisone once a day for 5 days.  Can use the patches as needed for breakthrough pain or additional management.  Keep your appointment for orthopedics next week on Thursday follow-up with them.  Return for ED if new or worsening symptoms such as urinary or fecal incontinence, shortness of breath, chest pain, inability to walk, ascending numbness, high-grade fever.

## 2022-05-21 ENCOUNTER — Ambulatory Visit (HOSPITAL_COMMUNITY)
Admission: EM | Admit: 2022-05-21 | Discharge: 2022-05-21 | Disposition: A | Discharge status: Other Acute Inpatient Hospital | Payer: Medicaid Other

## 2022-07-29 ENCOUNTER — Encounter (INDEPENDENT_AMBULATORY_CARE_PROVIDER_SITE_OTHER): Payer: Self-pay | Admitting: Podiatry

## 2022-07-29 NOTE — Progress Notes (Signed)
NS This encounter was created in error - please disregard.

## 2022-09-21 NOTE — Progress Notes (Signed)
Subjective:   Patient ID: Tammie Brown, female   DOB: 41 y.o.   MRN: [id number]   HPIerror   ROS      Objective:  Physical Exam       Assessment:       Plan:

## 2022-11-01 ENCOUNTER — Ambulatory Visit (HOSPITAL_COMMUNITY): Payer: Self-pay | Admitting: Student in an Organized Health Care Education/Training Program

## 2022-11-02 ENCOUNTER — Encounter (HOSPITAL_COMMUNITY): Payer: Self-pay | Admitting: Student in an Organized Health Care Education/Training Program

## 2022-11-02 ENCOUNTER — Ambulatory Visit (INDEPENDENT_AMBULATORY_CARE_PROVIDER_SITE_OTHER): Payer: Medicaid Other | Admitting: Student in an Organized Health Care Education/Training Program

## 2022-11-02 VITALS — BP 137/73 | HR 65 | Resp 20 | Wt 192.0 lb

## 2022-11-02 DIAGNOSIS — F314 Bipolar disorder, current episode depressed, severe, without psychotic features: Secondary | ICD-10-CM

## 2022-11-02 DIAGNOSIS — F1420 Cocaine dependence, uncomplicated: Secondary | ICD-10-CM | POA: Diagnosis not present

## 2022-11-02 DIAGNOSIS — F1220 Cannabis dependence, uncomplicated: Secondary | ICD-10-CM | POA: Diagnosis not present

## 2022-11-02 MED ORDER — QUETIAPINE FUMARATE 100 MG PO TABS: 100.0000 mg | ORAL_TABLET | Freq: Every day | ORAL | 1 refills | Status: AC

## 2022-11-02 MED ORDER — GABAPENTIN 100 MG PO CAPS: 100.0000 mg | ORAL_CAPSULE | Freq: Three times a day (TID) | ORAL | 2 refills | Status: AC
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
## 2022-11-05 ENCOUNTER — Telehealth (HOSPITAL_COMMUNITY): Payer: Self-pay | Admitting: Student in an Organized Health Care Education/Training Program

## 2022-11-08 NOTE — Telephone Encounter (Signed)
Briefly spoke with patient as follow-up to appt earlier in the week. Patient had not yet presented to [facility]. During call patient reported that she had decided that she would go to [facility], Saturday and consider staying for a weekend detox. Patient did not endorse any SI.  Objectively patient sounded less dysphoric than at her appt earlier in the week.   Weekend coverage at [facility] was notified that the patient may be coming in and would require monitoring for dysphoric mood and detox.   PGY-3 Damita Dunnings, MD

## 2022-11-16 ENCOUNTER — Encounter (HOSPITAL_COMMUNITY): Payer: Medicaid Other | Admitting: Student in an Organized Health Care Education/Training Program

## 2022-11-23 ENCOUNTER — Encounter (HOSPITAL_COMMUNITY): Payer: Medicaid Other | Admitting: Student in an Organized Health Care Education/Training Program

## 2023-03-09 DIAGNOSIS — M5441 Lumbago with sciatica, right side: Secondary | ICD-10-CM | POA: Diagnosis not present

## 2023-03-09 DIAGNOSIS — M5442 Lumbago with sciatica, left side: Secondary | ICD-10-CM | POA: Diagnosis not present

## 2023-03-09 DIAGNOSIS — Z131 Encounter for screening for diabetes mellitus: Secondary | ICD-10-CM | POA: Diagnosis not present

## 2023-03-09 DIAGNOSIS — E611 Iron deficiency: Secondary | ICD-10-CM | POA: Diagnosis not present

## 2023-03-09 DIAGNOSIS — G8929 Other chronic pain: Secondary | ICD-10-CM | POA: Diagnosis not present

## 2023-03-21 DIAGNOSIS — J029 Acute pharyngitis, unspecified: Secondary | ICD-10-CM | POA: Diagnosis not present

## 2023-03-21 DIAGNOSIS — H6691 Otitis media, unspecified, right ear: Secondary | ICD-10-CM | POA: Diagnosis not present

## 2023-03-31 DIAGNOSIS — M25552 Pain in left hip: Secondary | ICD-10-CM | POA: Diagnosis not present

## 2023-03-31 DIAGNOSIS — E559 Vitamin D deficiency, unspecified: Secondary | ICD-10-CM | POA: Diagnosis not present

## 2023-03-31 DIAGNOSIS — E669 Obesity, unspecified: Secondary | ICD-10-CM | POA: Diagnosis not present

## 2023-03-31 DIAGNOSIS — Z720 Tobacco use: Secondary | ICD-10-CM | POA: Diagnosis not present

## 2023-03-31 DIAGNOSIS — G8929 Other chronic pain: Secondary | ICD-10-CM | POA: Diagnosis not present

## 2023-03-31 DIAGNOSIS — Z13220 Encounter for screening for lipoid disorders: Secondary | ICD-10-CM | POA: Diagnosis not present

## 2023-03-31 DIAGNOSIS — Z1329 Encounter for screening for other suspected endocrine disorder: Secondary | ICD-10-CM | POA: Diagnosis not present

## 2023-03-31 DIAGNOSIS — E611 Iron deficiency: Secondary | ICD-10-CM | POA: Diagnosis not present

## 2023-03-31 DIAGNOSIS — M5442 Lumbago with sciatica, left side: Secondary | ICD-10-CM | POA: Diagnosis not present

## 2023-03-31 DIAGNOSIS — M5441 Lumbago with sciatica, right side: Secondary | ICD-10-CM | POA: Diagnosis not present

## 2023-03-31 DIAGNOSIS — Z131 Encounter for screening for diabetes mellitus: Secondary | ICD-10-CM | POA: Diagnosis not present

## 2023-05-31 ENCOUNTER — Ambulatory Visit (HOSPITAL_COMMUNITY)
Admission: EM | Admit: 2023-05-31 | Discharge: 2023-05-31 | Disposition: A | Discharge status: Home / Self Care | Payer: 59 | Attending: Family Medicine | Admitting: Family Medicine

## 2023-05-31 ENCOUNTER — Encounter (HOSPITAL_COMMUNITY): Payer: Self-pay | Admitting: *Deleted

## 2023-05-31 ENCOUNTER — Other Ambulatory Visit: Payer: Self-pay

## 2023-05-31 DIAGNOSIS — H66001 Acute suppurative otitis media without spontaneous rupture of ear drum, right ear: Secondary | ICD-10-CM

## 2023-05-31 DIAGNOSIS — J039 Acute tonsillitis, unspecified: Secondary | ICD-10-CM

## 2023-05-31 LAB — POCT RAPID STREP A (OFFICE): Rapid Strep A Screen: NEGATIVE

## 2023-05-31 MED ORDER — CLINDAMYCIN HCL 300 MG PO CAPS: 300.0000 mg | ORAL_CAPSULE | Freq: Three times a day (TID) | ORAL | 0 refills | Status: AC

## 2023-05-31 MED ORDER — METHYLPREDNISOLONE SODIUM SUCC 125 MG IJ SOLR
125.0000 mg | Freq: Once | INTRAMUSCULAR | Status: AC
  Administered 2023-05-31: 125 mg via INTRAMUSCULAR

## 2023-05-31 MED ORDER — PREDNISONE 20 MG PO TABS: 20.0000 mg | ORAL_TABLET | Freq: Every day | ORAL | 0 refills | Status: AC

## 2023-05-31 NOTE — Discharge Instructions (Addendum)
If your swelling worsens or you have difficulty breathing or inability to swallow fluids this is a medical emergency indicating the need to go to the emergency department.  You received Solu-Medrol which is a steroid this has helped the swelling in your throat go down so that you can start your oral antibiotics.  Prescribing you oral prednisone 20 mg daily over the next 4 days.  Take your first dose of prednisone tomorrow morning with breakfast.  Unfortunately unable to give you an injection of antibiotics due to your penicillin allergy.

## 2023-05-31 NOTE — ED Provider Notes (Signed)
[facility]    CSN: [id number] Arrival date & time: 05/31/23  1055      History   Chief Complaint Chief Complaint  Patient presents with   Sore Throat   Otalgia    HPI Tammie Brown is a 42 y.o. female.   HPI Patient presents today with plaints of right ear pain and sore throat.  She endorses that the sore throat and ear pain have worsened today.  She endorses some throat swelling.  She denies any other URI symptoms.  She is currently afebrile.  Denies any known sick contacts.  Patient has a history of peritonsillar abscess and has had frequent episodes of tonsillitis since 2020.  Reports probably 4 months ago she was treated by her primary care doctor for tonsillitis and symptoms did resolve with the antibiotics in which she was prescribed.  She denies any fever, nausea or vomiting.  She endorses that the Motrin tablet did get stuck in her throat when she attempted to swallow it earlier this morning however it subsequently go down.  She is able to drink fluids but having difficulty eating and does not have much of an appetite.  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 [place];  Service: Podiatry;  Laterality: Left;   FOOT SURGERY Left 2019 and 2020   GRAFT APPLICATION  Left 06/18/2020   Procedure: GRAFT  APPLICATION;  Surgeon: Park Liter, DPM;  Location: [facility] [place];  Service: Podiatry;  Laterality: Left;    OB History     Gravida  2   Para  2   Term  2   Preterm      AB      Living  2      SAB      IAB      Ectopic      Multiple      Live Births  1            Home Medications    Prior to Admission medications   Medication Sig Start Date End Date Taking? Authorizing Provider  clindamycin (CLEOCIN) 300 MG capsule Take 1 capsule (300 mg total) by mouth 3 (three) times daily for 7 days. 05/31/23 06/07/23 Yes Bing Neighbors, NP  predniSONE (DELTASONE) 20 MG tablet Take 1 tablet (20 mg total) by mouth daily with breakfast for 4 days. 05/31/23 06/04/23 Yes Bing Neighbors, NP  baclofen (LIORESAL) 10 MG tablet Take 10 mg by mouth 2 (two) times daily. 02/20/20   [provider]  celecoxib (CELEBREX) 100 MG capsule Take 100 mg by mouth every other day.  02/20/20   [provider]  diazepam (VALIUM) 5 MG tablet Take 1  tablet 30 mins prior to procedure. Bring other tablet with you to the appointment. 07/09/20   Park Liter, DPM  fexofenadine-pseudoephedrine (ALLEGRA-D) 60-120 MG 12 hr tablet Take 1 tablet by mouth every 12 (twelve) hours. Patient taking differently: Take 1 tablet by mouth every 12 (twelve) hours. Out of medication will refill 04/05/20   Gilda Crease, MD  gabapentin (NEURONTIN) 100 MG capsule Take 1 capsule (100 mg total) by mouth 3 (three) times daily. 11/02/22 11/02/23  Bobbye Morton, MD  ibuprofen (ADVIL) 800 MG tablet Take 1 tablet (800 mg total) by mouth every 8 (eight) hours as needed. 07/13/19   Park Liter, DPM  Methyl Salicylate-Lido-Menthol 4-4-5 % PTCH Apply 1 patch topically daily. 01/15/22   Cecil Cobbs, PA-C  Multiple Vitamin (MULTI-VITAMIN) tablet Take by mouth.    [provider]  NARCAN 4 MG/0.1ML LIQD nasal spray kit SMARTSIG:1 Spray(s)  Both Nares PRN 02/11/20   [provider]  oxyCODONE-acetaminophen (PERCOCET) 10-325 MG tablet Take 1 tablet by mouth every 4 (four) hours as needed for pain. 07/10/20   Park Liter, DPM  QUEtiapine (SEROQUEL) 100 MG tablet Take 1 tablet (100 mg total) by mouth at bedtime. Start with 50mg  a night x1 night then increase to 100mg  nightly. 11/02/22   Bobbye Morton, MD  Vitamin D, Ergocalciferol, (DRISDOL) 1.25 MG (50000 UNIT) CAPS capsule Take 50,000 Units by mouth once a week. 02/08/20   [provider]    Family History Family History  Problem Relation Age of Onset   Asthma Mother    Hypertension Mother    Diabetes Father    Cancer Maternal Grandfather        throat   Other Neg Hx    Hearing loss Neg Hx     Social History Social History   Tobacco Use   Smoking status: Former    Years: 15    Types: Cigarettes   Smokeless tobacco: Never   Tobacco comments:    occ smoker  Vaping Use   Vaping Use: Never used  Substance Use Topics   Alcohol use: Yes    Comment: occ    Drug use: Yes    Frequency: 1.0 times per week    Types: Marijuana, Cocaine     Allergies   Penicillins and Tuberculin tests   Review of Systems Review of Systems Pertinent negatives listed in HPI   Physical Exam Triage Vital Signs ED Triage Vitals  Enc Vitals Group     BP 05/31/23 1135 102/62     Pulse Rate 05/31/23 1135 63     Resp 05/31/23 1135 18     Temp 05/31/23 1135 98.3 F (36.8 C)     Temp src --      SpO2 05/31/23 1135 97 %     Weight --      Height --      Head Circumference --      Peak Flow --      Pain Score 05/31/23 1131 9     Pain Loc --      Pain Edu? --      Excl. in GC? --    No data found.  Updated Vital Signs BP 102/62   Pulse 63   Temp 98.3 F (36.8 C)   Resp 18   LMP 05/26/2023   SpO2 97%   Visual Acuity Right Eye Distance:   Left Eye Distance:   Bilateral Distance:    Right Eye Near:  Left Eye Near:    Bilateral Near:      Physical Exam Vitals reviewed.  Constitutional:      Appearance: She is ill-appearing.  HENT:     Head: Normocephalic and atraumatic.     Right Ear: Swelling and tenderness present. A middle ear effusion is present. Tympanic membrane is erythematous.     Left Ear: Tympanic membrane and ear canal normal. No drainage, swelling or tenderness.  No middle ear effusion. Tympanic membrane is not erythematous.     Nose: No congestion or rhinorrhea.     Mouth/Throat:     Mouth: Mucous membranes are moist.     Pharynx: Posterior oropharyngeal erythema present.     Tonsils: 3+ on the right. 3+ on the left.     Comments: Poor visualization of the posterior pharyngeal region given patient's discomfort tonsillar swelling pain with swallowing Eyes:     Conjunctiva/sclera: Conjunctivae normal.     Pupils: Pupils are equal, round, and reactive to light.  Cardiovascular:     Rate and Rhythm: Normal rate and regular rhythm.  Pulmonary:     Effort: Pulmonary effort is normal.     Breath sounds: Normal breath sounds.  Musculoskeletal:     Cervical back: Normal range of motion.  Lymphadenopathy:     Cervical: Cervical adenopathy present.  Skin:    Capillary Refill: Capillary refill takes less than 2 seconds.  Neurological:     General: No focal deficit present.      UC Treatments / Results  Labs (all labs ordered are listed, but only abnormal results are displayed) Labs Reviewed  POCT RAPID STREP A (OFFICE)    EKG   Radiology No results found.  Procedures Procedures (including critical care time)  Medications Ordered in UC Medications  methylPREDNISolone sodium succinate (SOLU-MEDROL) 125 mg/2 mL injection 125 mg (125 mg Intramuscular Given 05/31/23 1205)    Initial Impression / Assessment and Plan / UC Course  I have reviewed the triage vital signs and the nursing notes.  Pertinent labs & imaging results that were available during my care of the patient were reviewed by me and  considered in my medical decision making (see chart for details).    Patient has significant tonsillar swelling however is able to tolerate fluids at present.  Given her history of peritonsillar abscess patient given strict precautions to go to the ED if the swelling in her throat does not improve with Solu-Medrol injection.  I started her on clindamycin given her penicillin allergy for tonsillitis.  Also will continue 20 mg of prednisone over the course of the next 5 days to help with oropharyngeal swelling. Patient verbalized understanding agreement with plan if symptoms worsens to go to the ED. Final Clinical Impressions(s) / UC Diagnoses   Final diagnoses:  Acute tonsillitis, unspecified etiology  Non-recurrent acute suppurative otitis media of right ear without spontaneous rupture of tympanic membrane     Discharge Instructions      If your swelling worsens or you have difficulty breathing or inability to swallow fluids this is a medical emergency indicating the need to go to the emergency department.  You received Solu-Medrol which is a steroid this has helped the swelling in your throat go down so that you can start your oral antibiotics.  Prescribing you oral prednisone 20 mg daily over the next 4 days.  Take your first dose of prednisone tomorrow morning with breakfast.  Unfortunately unable to give you an injection of antibiotics due to your  penicillin allergy.      ED Prescriptions     Medication Sig Dispense Auth. Provider   clindamycin (CLEOCIN) 300 MG capsule Take 1 capsule (300 mg total) by mouth 3 (three) times daily for 7 days. 21 capsule Bing Neighbors, NP   predniSONE (DELTASONE) 20 MG tablet Take 1 tablet (20 mg total) by mouth daily with breakfast for 4 days. 4 tablet Bing Neighbors, NP      PDMP not reviewed this encounter.   Bing Neighbors, NP 05/31/23 1313

## 2023-05-31 NOTE — ED Triage Notes (Addendum)
Pt reports sore throat ,RT ear pain and throat swelling . All Sx's are worse today. Pt tried to swallow a ibuprofen  today and it got stuck ,per Pt.

## 2023-06-03 ENCOUNTER — Ambulatory Visit: Payer: 59 | Admitting: Podiatry

## 2023-06-03 DIAGNOSIS — M722 Plantar fascial fibromatosis: Secondary | ICD-10-CM

## 2024-09-06 ENCOUNTER — Ambulatory Visit: Admitting: Podiatry
